# Patient Record
Sex: Female | Born: 1950 | Race: White | Hispanic: No | Marital: Married | Smoking: Former smoker
Health system: Southern US, Community
[De-identification: ages and names within clinical notes are randomized; demographics above are authoritative.]

---

## 2002-01-20 ENCOUNTER — Emergency Department (HOSPITAL_COMMUNITY): Admission: EM | Admit: 2002-01-20 | Discharge: 2002-01-20 | Payer: Self-pay

## 2011-07-31 ENCOUNTER — Ambulatory Visit (INDEPENDENT_AMBULATORY_CARE_PROVIDER_SITE_OTHER): Payer: 59

## 2011-07-31 DIAGNOSIS — J029 Acute pharyngitis, unspecified: Secondary | ICD-10-CM

## 2011-07-31 DIAGNOSIS — H9209 Otalgia, unspecified ear: Secondary | ICD-10-CM

## 2018-03-17 ENCOUNTER — Ambulatory Visit (INDEPENDENT_AMBULATORY_CARE_PROVIDER_SITE_OTHER): Payer: Medicare Other | Admitting: Physician Assistant

## 2018-03-17 VITALS — BP 149/88 | HR 68 | Temp 98.0°F | Resp 16 | Ht 66.0 in | Wt 214.0 lb

## 2018-03-17 DIAGNOSIS — R208 Other disturbances of skin sensation: Secondary | ICD-10-CM | POA: Diagnosis not present

## 2018-03-17 DIAGNOSIS — L255 Unspecified contact dermatitis due to plants, except food: Secondary | ICD-10-CM

## 2018-03-17 MED ORDER — CEPHALEXIN 500 MG PO CAPS: 500.0000 mg | ORAL_CAPSULE | Freq: Two times a day (BID) | ORAL | 0 refills | Status: DC

## 2018-03-17 MED ORDER — PREDNISONE 20 MG PO TABS: ORAL_TABLET | ORAL | 0 refills | Status: AC

## 2018-03-17 NOTE — Patient Instructions (Addendum)
    Take zyrtec 10 mg daily.     If you have lab work done today you will be contacted with your lab results within the next 2 weeks.  If you have not heard from us then please contact us. The fastest way to get your results is to register for My Chart.   IF you received an x-ray today, you will receive an invoice from [facility]. Please contact [facility] at [phone] with questions or concerns regarding your invoice.   IF you received labwork today, you will receive an invoice from [facility]. Please contact [facility] at [phone] with questions or concerns regarding your invoice.   Our billing staff will not be able to assist you with questions regarding bills from these companies.  You will be contacted with the lab results as soon as they are available. The fastest way to get your results is to activate your My Chart account. Instructions are located on the last page of this paperwork. If you have not heard from us regarding the results in 2 weeks, please contact this office.

## 2018-03-17 NOTE — Progress Notes (Signed)
    03/17/2018 8:47 AM   DOB: [birth year] / MRN: [id number]  SUBJECTIVE:  Nicole Moreno is a 67 y.o. 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 Symptoms present for 3 days.  The problem is worsening. She has tried calamine. Assoicates a painful place about the rash on her left arm.   She has no allergies on file.   She  has no past medical history on file.    She   She  has no sexual activity history on file. The patient  has no past surgical history on file.  Her family history is not on file.  Review of Systems  Constitutional: Negative for chills and fever.  Gastrointestinal: Negative for nausea.  Skin: Positive for itching and rash.  Neurological: Negative for dizziness.    The problem list and medications were reviewed and updated by myself where necessary and exist elsewhere in the encounter.   OBJECTIVE:  BP (!) 149/88   Pulse 68   Temp 98 F (36.7 C) (Oral)   Resp 16   Ht 5\' 6"  (1.676 m)   Wt 214 lb (97.1 kg)   SpO2 97%   BMI 34.54 kg/m   Wt Readings from Last 3 Encounters:  03/17/18 214 lb (97.1 kg)   Temp Readings from Last 3 Encounters:  03/17/18 98 F (36.7 C) (Oral)   BP Readings from Last 3 Encounters:  03/17/18 (!) 149/88   Pulse Readings from Last 3 Encounters:  03/17/18 68    Physical Exam  Constitutional: She is oriented to person, place, and time. She appears well-nourished. No distress.  Eyes: Pupils are equal, round, and reactive to light. EOM are normal.  Cardiovascular: Normal rate.  Pulmonary/Chest: Effort normal.  Abdominal: She exhibits no distension.  Neurological: She is alert and oriented to person, place, and time. No cranial nerve deficit. Gait normal.  Skin: Skin is dry. She is not diaphoretic.  Psychiatric: She has a normal mood and affect.  Vitals reviewed.            ASSESSMENT AND PLAN:  Jacqueline was seen today for rash.  Diagnoses and all orders for this  visit:  Rhus dermatitis -     predniSONE (DELTASONE) 20 MG tablet; Take 3 in the morning for 5 days, then 2 in the morning for 5 days, and then 1 in the morning for 5 days.  Localized tenderness of skin -     cephALEXin (KEFLEX) 500 MG capsule; Take 1 capsule (500 mg total) by mouth 2 (two) times daily.    The patient is advised to call or return to clinic if she does not see an improvement in symptoms, or to seek the care of the closest emergency department if she worsens with the above plan.   Deliah BostonMichael Clark, MHS, PA-C Primary Care at [facility] 03/17/2018 8:47 AM

## 2019-06-13 ENCOUNTER — Emergency Department (HOSPITAL_COMMUNITY): Payer: Medicare Other

## 2019-06-13 ENCOUNTER — Inpatient Hospital Stay (HOSPITAL_COMMUNITY): Payer: Medicare Other

## 2019-06-13 ENCOUNTER — Encounter (HOSPITAL_COMMUNITY): Payer: Self-pay | Admitting: Emergency Medicine

## 2019-06-13 ENCOUNTER — Inpatient Hospital Stay (HOSPITAL_COMMUNITY)
Admission: EM | Admit: 2019-06-13 | Discharge: 2019-06-23 | DRG: 233 | Disposition: E | Payer: Medicare Other | Attending: Surgery | Admitting: Surgery

## 2019-06-13 ENCOUNTER — Other Ambulatory Visit: Payer: Self-pay

## 2019-06-13 DIAGNOSIS — I509 Heart failure, unspecified: Secondary | ICD-10-CM

## 2019-06-13 DIAGNOSIS — I4901 Ventricular fibrillation: Secondary | ICD-10-CM | POA: Diagnosis not present

## 2019-06-13 DIAGNOSIS — J9601 Acute respiratory failure with hypoxia: Secondary | ICD-10-CM | POA: Diagnosis present

## 2019-06-13 DIAGNOSIS — I472 Ventricular tachycardia, unspecified: Secondary | ICD-10-CM

## 2019-06-13 DIAGNOSIS — I214 Non-ST elevation (NSTEMI) myocardial infarction: Principal | ICD-10-CM | POA: Diagnosis present

## 2019-06-13 DIAGNOSIS — I420 Dilated cardiomyopathy: Secondary | ICD-10-CM | POA: Diagnosis present

## 2019-06-13 DIAGNOSIS — E7800 Pure hypercholesterolemia, unspecified: Secondary | ICD-10-CM

## 2019-06-13 DIAGNOSIS — Z79899 Other long term (current) drug therapy: Secondary | ICD-10-CM

## 2019-06-13 DIAGNOSIS — E119 Type 2 diabetes mellitus without complications: Secondary | ICD-10-CM | POA: Diagnosis present

## 2019-06-13 DIAGNOSIS — R0602 Shortness of breath: Secondary | ICD-10-CM | POA: Diagnosis present

## 2019-06-13 DIAGNOSIS — Z7982 Long term (current) use of aspirin: Secondary | ICD-10-CM

## 2019-06-13 DIAGNOSIS — I5021 Acute systolic (congestive) heart failure: Secondary | ICD-10-CM | POA: Diagnosis not present

## 2019-06-13 DIAGNOSIS — Z20828 Contact with and (suspected) exposure to other viral communicable diseases: Secondary | ICD-10-CM | POA: Diagnosis present

## 2019-06-13 DIAGNOSIS — J449 Chronic obstructive pulmonary disease, unspecified: Secondary | ICD-10-CM | POA: Diagnosis present

## 2019-06-13 DIAGNOSIS — I5023 Acute on chronic systolic (congestive) heart failure: Secondary | ICD-10-CM | POA: Diagnosis present

## 2019-06-13 DIAGNOSIS — Z6841 Body Mass Index (BMI) 40.0 and over, adult: Secondary | ICD-10-CM | POA: Diagnosis not present

## 2019-06-13 DIAGNOSIS — Z87891 Personal history of nicotine dependence: Secondary | ICD-10-CM | POA: Diagnosis not present

## 2019-06-13 DIAGNOSIS — I340 Nonrheumatic mitral (valve) insufficiency: Secondary | ICD-10-CM | POA: Diagnosis not present

## 2019-06-13 DIAGNOSIS — Z9289 Personal history of other medical treatment: Secondary | ICD-10-CM

## 2019-06-13 DIAGNOSIS — Z8249 Family history of ischemic heart disease and other diseases of the circulatory system: Secondary | ICD-10-CM

## 2019-06-13 DIAGNOSIS — Z823 Family history of stroke: Secondary | ICD-10-CM | POA: Diagnosis not present

## 2019-06-13 DIAGNOSIS — E876 Hypokalemia: Secondary | ICD-10-CM

## 2019-06-13 DIAGNOSIS — I2510 Atherosclerotic heart disease of native coronary artery without angina pectoris: Secondary | ICD-10-CM | POA: Diagnosis present

## 2019-06-13 DIAGNOSIS — R570 Cardiogenic shock: Secondary | ICD-10-CM | POA: Diagnosis not present

## 2019-06-13 DIAGNOSIS — I469 Cardiac arrest, cause unspecified: Secondary | ICD-10-CM | POA: Diagnosis not present

## 2019-06-13 DIAGNOSIS — R7989 Other specified abnormal findings of blood chemistry: Secondary | ICD-10-CM

## 2019-06-13 DIAGNOSIS — I249 Acute ischemic heart disease, unspecified: Secondary | ICD-10-CM | POA: Diagnosis present

## 2019-06-13 DIAGNOSIS — E785 Hyperlipidemia, unspecified: Secondary | ICD-10-CM | POA: Diagnosis present

## 2019-06-13 DIAGNOSIS — Z951 Presence of aortocoronary bypass graft: Secondary | ICD-10-CM

## 2019-06-13 DIAGNOSIS — J9600 Acute respiratory failure, unspecified whether with hypoxia or hypercapnia: Secondary | ICD-10-CM | POA: Diagnosis present

## 2019-06-13 DIAGNOSIS — R778 Other specified abnormalities of plasma proteins: Secondary | ICD-10-CM | POA: Diagnosis not present

## 2019-06-13 DIAGNOSIS — Z09 Encounter for follow-up examination after completed treatment for conditions other than malignant neoplasm: Secondary | ICD-10-CM

## 2019-06-13 DIAGNOSIS — Z9911 Dependence on respirator [ventilator] status: Secondary | ICD-10-CM

## 2019-06-13 DIAGNOSIS — I25110 Atherosclerotic heart disease of native coronary artery with unstable angina pectoris: Secondary | ICD-10-CM | POA: Diagnosis not present

## 2019-06-13 DIAGNOSIS — Z01810 Encounter for preprocedural cardiovascular examination: Secondary | ICD-10-CM | POA: Diagnosis not present

## 2019-06-13 LAB — BRAIN NATRIURETIC PEPTIDE: B Natriuretic Peptide: 465 pg/mL — ABNORMAL HIGH (ref 0.0–100.0)

## 2019-06-13 LAB — CBC WITH DIFFERENTIAL/PLATELET
Abs Immature Granulocytes: 0.06 10*3/uL (ref 0.00–0.07)
Basophils Absolute: 0.1 10*3/uL (ref 0.0–0.1)
Basophils Relative: 1 %
Eosinophils Absolute: 0.1 10*3/uL (ref 0.0–0.5)
Eosinophils Relative: 1 %
HCT: 43.7 % (ref 36.0–46.0)
Hemoglobin: 14.3 g/dL (ref 12.0–15.0)
Immature Granulocytes: 1 %
Lymphocytes Relative: 15 %
Lymphs Abs: 1.9 10*3/uL (ref 0.7–4.0)
MCH: 30.2 pg (ref 26.0–34.0)
MCHC: 32.7 g/dL (ref 30.0–36.0)
MCV: 92.4 fL (ref 80.0–100.0)
Monocytes Absolute: 0.8 10*3/uL (ref 0.1–1.0)
Monocytes Relative: 7 %
Neutro Abs: 9.9 10*3/uL — ABNORMAL HIGH (ref 1.7–7.7)
Neutrophils Relative %: 75 %
Platelets: 263 10*3/uL (ref 150–400)
RBC: 4.73 MIL/uL (ref 3.87–5.11)
RDW: 13.2 % (ref 11.5–15.5)
WBC: 12.8 10*3/uL — ABNORMAL HIGH (ref 4.0–10.5)
nRBC: 0 % (ref 0.0–0.2)

## 2019-06-13 LAB — TROPONIN I (HIGH SENSITIVITY)
Troponin I (High Sensitivity): 388 ng/L (ref <18)
Troponin I (High Sensitivity): 349 ng/L (ref <18)

## 2019-06-13 LAB — BASIC METABOLIC PANEL
Anion gap: 11 (ref 5–15)
BUN: 17 mg/dL (ref 8–23)
CO2: 21 mmol/L — ABNORMAL LOW (ref 22–32)
Calcium: 9.1 mg/dL (ref 8.9–10.3)
Chloride: 108 mmol/L (ref 98–111)
Creatinine, Ser: 0.7 mg/dL (ref 0.44–1.00)
GFR calc Af Amer: >60 mL/min (ref >60)
GFR calc non Af Amer: >60 mL/min (ref >60)
Glucose, Bld: 128 mg/dL — ABNORMAL HIGH (ref 70–99)
Potassium: 3.5 mmol/L (ref 3.5–5.1)
Sodium: 140 mmol/L (ref 135–145)

## 2019-06-13 LAB — BLOOD GAS, ARTERIAL
Acid-base deficit: 4.4 mmol/L — ABNORMAL HIGH (ref 0.0–2.0)
Bicarbonate: 20.4 mmol/L (ref 20.0–28.0)
FIO2: 100
O2 Saturation: 99 %
Patient temperature: 38
pCO2 arterial: 44.5 mmHg (ref 32.0–48.0)
pH, Arterial: 7.296 — ABNORMAL LOW (ref 7.350–7.450)
pO2, Arterial: 248 mmHg — ABNORMAL HIGH (ref 83.0–108.0)

## 2019-06-13 LAB — PROCALCITONIN: Procalcitonin: <0.1 ng/mL

## 2019-06-13 LAB — PROTIME-INR
INR: 1.1 (ref 0.8–1.2)
Prothrombin Time: 14.1 seconds (ref 11.4–15.2)

## 2019-06-13 MED ORDER — IOHEXOL 350 MG/ML SOLN: 100.0000 mL | Freq: Once | INTRAVENOUS | Status: DC | PRN

## 2019-06-13 MED ORDER — HEPARIN BOLUS VIA INFUSION
4000.0000 [IU] | Freq: Once | INTRAVENOUS | Status: AC
  Administered 2019-06-13: 4000 [IU] via INTRAVENOUS

## 2019-06-13 MED ORDER — ALBUTEROL SULFATE HFA 108 (90 BASE) MCG/ACT IN AERS
1.0000 | INHALATION_SPRAY | RESPIRATORY_TRACT | Status: DC | PRN
  Filled 2019-06-13: qty 6.7

## 2019-06-13 MED ORDER — ALBUTEROL SULFATE HFA 108 (90 BASE) MCG/ACT IN AERS
2.0000 | INHALATION_SPRAY | Freq: Four times a day (QID) | RESPIRATORY_TRACT | Status: DC
  Administered 2019-06-13: 2 via RESPIRATORY_TRACT
  Filled 2019-06-13: qty 6.7

## 2019-06-13 MED ORDER — FUROSEMIDE 10 MG/ML IJ SOLN
60.0000 mg | Freq: Once | INTRAMUSCULAR | Status: AC
  Administered 2019-06-13: 60 mg via INTRAVENOUS
  Filled 2019-06-13: qty 6

## 2019-06-13 MED ORDER — ONDANSETRON HCL 4 MG PO TABS: 4.0000 mg | ORAL_TABLET | Freq: Four times a day (QID) | ORAL | Status: DC | PRN

## 2019-06-13 MED ORDER — HEPARIN (PORCINE) 25000 UT/250ML-% IV SOLN
1750.0000 [IU]/h | INTRAVENOUS | Status: DC
  Administered 2019-06-13: 1200 [IU]/h via INTRAVENOUS
  Administered 2019-06-15: 1750 [IU]/h via INTRAVENOUS
  Filled 2019-06-13 (×3): qty 250

## 2019-06-13 MED ORDER — POTASSIUM CHLORIDE CRYS ER 20 MEQ PO TBCR
40.0000 meq | EXTENDED_RELEASE_TABLET | Freq: Once | ORAL | Status: AC
  Administered 2019-06-13: 40 meq via ORAL
  Filled 2019-06-13: qty 2

## 2019-06-13 MED ORDER — ACETAMINOPHEN 325 MG PO TABS: 650.0000 mg | ORAL_TABLET | Freq: Four times a day (QID) | ORAL | Status: DC | PRN

## 2019-06-13 MED ORDER — POLYETHYLENE GLYCOL 3350 17 G PO PACK: 17.0000 g | PACK | Freq: Every day | ORAL | Status: DC | PRN

## 2019-06-13 MED ORDER — ONDANSETRON HCL 4 MG/2ML IJ SOLN: 4.0000 mg | Freq: Four times a day (QID) | INTRAMUSCULAR | Status: DC | PRN

## 2019-06-13 MED ORDER — ACETAMINOPHEN 650 MG RE SUPP: 650.0000 mg | Freq: Four times a day (QID) | RECTAL | Status: DC | PRN

## 2019-06-13 MED ORDER — LORAZEPAM 2 MG/ML IJ SOLN
1.0000 mg | Freq: Once | INTRAMUSCULAR | Status: AC
  Administered 2019-06-13: 1 mg via INTRAVENOUS
  Filled 2019-06-13: qty 1

## 2019-06-13 MED ORDER — FUROSEMIDE 10 MG/ML IJ SOLN
40.0000 mg | Freq: Two times a day (BID) | INTRAMUSCULAR | Status: DC
  Administered 2019-06-14 – 2019-06-15 (×3): 40 mg via INTRAVENOUS
  Filled 2019-06-13 (×3): qty 4

## 2019-06-13 MED ORDER — ASPIRIN 81 MG PO CHEW
324.0000 mg | CHEWABLE_TABLET | Freq: Once | ORAL | Status: AC
  Administered 2019-06-13: 324 mg via ORAL
  Filled 2019-06-13: qty 4

## 2019-06-13 NOTE — Progress Notes (Signed)
Rt went into room to check BIPAP and found patient sitting in chair talking on the phone to family. Patient had urinated on herself because she had to wait to long to go to the bathroom.She stated that she was feeling better and wanted to go home. She stated she was not going to [place]. She spoke with the physician and they told her since she was feeling better she didn't need to go to [place]. So patient agreed that she would remain in hospital here at [facility] and be taken care of. Patient stated she had removed her BIPAP and all her monitor leads because she had to go to the bathroom. Rt placed her pulse ox back on to check her saturation and her SpO2 was 95-98% on room air. BIPAP is still in the room on stand by. Rt will continue to monitor patient.

## 2019-06-13 NOTE — H&P (Addendum)
History and Physical    Nicole Moreno ZOX:[id number] DOB: [birth year] DOA: 01-03-19  PCP: Patient, No Pcp Per   Patient coming from: Home  I have personally briefly reviewed patient's old medical records in [facility]  Chief Complaint: Chest pain, SOB  HPI: Nicole Moreno is a [age] y.o. female with no known medical history significant presented to the Ed with c/o chest pain and difficulty breathing of 3 weeks duration. Difficulty breathing was especially worse with exertion, over the past week. Chest pain- non radiating also present with exertion and improves with rest. On my initial evaluation patient was on BIPAP and mildly lethargic.  She answers a few questions by nodding, but most of the history was obtained from chart review and ED provider. But she denied cough and COVID positive contacts, no fevers, she also denies weight gain. Father had heart disease at age of 68.  ED Course: heart rate initially WNL, later tachycardic to 114, tachypneic, O2 sats 88% on room air, patient respiratory status suddenly declined with Increased work of breathing and tachypnea, subsequently moved to negative pressure room and placed on BIPAP.  COVID-19 pending.  Initial chest x-ray 2 view diffuse linear and hazy perihilar lung opacities pulmonary edema favored.  Atypical infection on the differential.  Repeat portable chest x-ray - worsening lung aeration compared to earlier exam, increased interstitial and hazy airspace lung opacities.  Pulmonary edema favored as etiology.   BNP elevated at 465,  Hs troponin 388 > 349.  60 mg IV Lasix x1 given. EDP talked to cardiology after first troponin, recommendations to rule out pulmonary embolism with CTA chest, if negative consider ACS as likely etiology and [facility] management.  IV heparin GTT recommended.  Review of Systems: As per HPI all other systems reviewed and negative.  History reviewed. No pertinent past medical history.  History  reviewed. No pertinent surgical history.   reports that she quit smoking about 14 years ago. She has never used smokeless tobacco. She reports that she does not drink alcohol or use drugs.  No Known Allergies  Father had heart disease at age of 68.  Prior to Admission medications   Not on File    Physical Exam: Vitals:   Dec 12, 2018 1800 Dec 12, 2018 1830 Dec 12, 2018 1840 Dec 12, 2018 1915  BP: (!) 150/103     Pulse: (!) 110 (!) 103  (!) 111  Resp: (!) 32 (!) 26  (!) 22  Temp:      TempSrc:      SpO2: 95% 100% 92% 100%  Weight:      Height:        Constitutional: On BiPAP, lethargic but easily arousable Vitals:   Dec 12, 2018 1800 Dec 12, 2018 1830 Dec 12, 2018 1840 Dec 12, 2018 1915  BP: (!) 150/103     Pulse: (!) 110 (!) 103  (!) 111  Resp: (!) 32 (!) 26  (!) 22  Temp:      TempSrc:      SpO2: 95% 100% 92% 100%  Weight:      Height:       Eyes: PERRL, lids and conjunctivae normal ENMT: On BiPAP.   Neck: normal, supple, no masses, no thyromegaly Respiratory: On BiPAP, diffuse expiratory wheezing- likely cardiac wheeze, Normal respiratory effort. No accessory muscle use.  Cardiovascular: Regular rate and rhythm, no murmurs / rubs / gallops.  Trace bilateral extremity edema. 2+ pedal pulses.  Abdomen: no tenderness, no masses palpated. No hepatosplenomegaly. Bowel sounds positive.  Musculoskeletal: no clubbing /  cyanosis. No joint deformity upper and lower extremities. Good ROM, no contractures. Normal muscle tone.  Skin: no rashes, lesions, ulcers. No induration Neurologic: CN 2-12 grossly intact. Strength 5/5 in all 4.  Psychiatric: Normal judgment and insight.  Lethargic. Normal mood.   Labs on Admission: I have personally reviewed following labs and imaging studies  CBC: Recent Labs  Lab 06/17/2019 1507  WBC 12.8*  NEUTROABS 9.9*  HGB 14.3  HCT 43.7  MCV 92.4  PLT 846   Basic Metabolic Panel: Recent Labs  Lab 05/26/2019 1507  NA 140  K 3.5  CL 108  CO2 21*  GLUCOSE 128*  BUN 17   CREATININE 0.70  CALCIUM 9.1   Coagulation Profile: Recent Labs  Lab 06/03/2019 1813  INR 1.1   Radiological Exams on Admission: Dg Chest 2 View  Result Date: 06/05/2019 CLINICAL DATA:  Worsening dyspnea EXAM: CHEST - 2 VIEW COMPARISON:  None. FINDINGS: Borderline enlargement of the cardiopericardial silhouette. Otherwise normal mediastinal contour. No pneumothorax. Trace right pleural effusion. No left pleural effusion. Diffuse linear and hazy parahilar lung opacities. IMPRESSION: 1. Diffuse linear and hazy parahilar lung opacities, favor pulmonary edema given borderline mild enlargement of the cardiopericardial silhouette, with atypical infection on the differential. 2. Trace right pleural effusion. Electronically Signed   By: Ilona Sorrel M.D.   On: 06/22/2019 15:47   Dg Chest Portable 1 View  Result Date: 05/25/2019 CLINICAL DATA:  Worsening shortness of breath. EXAM: PORTABLE CHEST 1 VIEW COMPARISON:  06/03/2019 at 3:21 a.m. FINDINGS: Lung opacities have increased compared to the earlier exam. There are interstitial and hazy airspace opacities bilaterally, centrally predominant. Lung base opacity partially obscures the hemidiaphragms suggesting small effusions. IMPRESSION: 1. Worsened lung aeration compared to the earlier exam. Increased interstitial and hazy airspace lung opacities. Pulmonary edema favored as the etiology. Electronically Signed   By: Lajean Manes M.D.   On: 05/25/2019 18:38    EKG: Independently reviewed.  Sinus rhythm QTc 468.  T wave inversions in lead I and aVL, repolarization abnormality in lead V3 only.  No old EKG to compare.  Assessment/Plan Active Problems:   Acute respiratory failure (HCC)    Acute hypoxic respiratory failure-O2 sats initially 88% on room air, decline in respiratory status in the ED subsequently placed on BiPAP.  Sats greater than 90% at this time.  COVID-19 test pending.  WBC 12.8.  BNP elevated at 465.  Repeat chest x-ray in the ED shows  worsening lung aeration compared to earlier exam.  Pulmonary edema favored as etiology.  Trace bilateral lower extremity edema.  Few weights in chart, so ?? Accuracy, but appears over the past year patient has gained weight 214lbs  02/2018 to 283 pounds today.  Etiology of respiratory failure likely congestive heart failure/pulmonary edema. -Rule out PE with CTA chest  -In the interim continue heparin GTT -Follow-up COVID-19 test -IV Lasix 60 mg x 1 given in ED, continue 40 mg every 12 hourly -Obtain echocardiogram -Strict input output, daily weights, fluid restrict -BMP a.m, CBC a.m. -Obtain pro-calcitonin- <0.1 - Scheduled and PRN albuterol inhaler -Continue BiPAP for now, supplemental O2, respiratory protocol -ABG shows pH of 7.29, PCO2 of 44, PaO2 248 on BiPAP.  Serum bicarb of 21, with normal anion gap 11, suggesting a normal anion gap metabolic acidosis.  -Addendum- patient feeling better after Lasix, suggesting more of CHF as etiology.  On my reevaluation she is alert and oriented.  Initially requested to leave the hospital, later agreed to stay.  She vehemently declines transfer to [facility].  I have explained we do not have cardiology here tomorrow, she still wants to be admitted here at [facility].   NSTEMI-anginal type chest pain, Hs troponin 388 >> 349.  EKG with T wave abnormalities in lateral leads, ?  Repol abnormalities ST segment V3 only.  Elevated troponin likely from demand ischemia from congestive heart failure.  Also concern for ACS- but downtrending troponin reassuring.  EDP talked to cardiology with recommendations to obtain CTA rule out PE otherwise consider ACS, start heparin GTT and consider transfer to [facility]. -Aspirin 325 given in ED - Cont heparin GTT for now -Repeat EKG in a.m. -Obtain echocardiogram -Pending CTA results, may need benefit from cardiology consultation in a.m. -Repeat troponin  DVT prophylaxis: Heparin Code Status: Full code Family  Communication: None at bedside Disposition Plan: > 2 days Consults called: May need cardiology consult pending further work-up. Admission status: Inpatient, stepdown I certify that at the point of admission it is my clinical judgment that the patient will require inpatient hospital care spanning beyond 2 midnights from the point of admission due to high intensity of service, high risk for further deterioration and high frequency of surveillance required. The following factors support the patient status of inpatient: Acute respiratory failure requiring respiratory support with BiPAP at this time on his stepdown level of care.   Onnie Boer MD [facility]  18-Jun-2019, 10:02 PM

## 2019-06-13 NOTE — ED Triage Notes (Signed)
Pt states she has been progressively short of breath for the past 3 weeks without fever or significant cough. States within the past few days she would be unable to walk 25 ft without significant difficulty.

## 2019-06-13 NOTE — ED Notes (Signed)
Paged Dr. Harl Bowie, Cardiology again as requested by Dr. Roslynn Amble

## 2019-06-13 NOTE — Progress Notes (Addendum)
Discussed patient with ER staff. Presents with SOB, some chest pain. Elevated trop, some lateral ST/T changes on EKG in setting of LVH may be strain vs ischemia. She is getting a CT PE, if negative would think ACS is likely etiology and would plan for transfer to [facility] for management as NSTEMI. Some hypoxia and fluid on CXR with elevated BNP, have recommended some IV lasix in ER, have also recommended starting IV hep gtt   Zandra Abts MD

## 2019-06-13 NOTE — ED Provider Notes (Addendum)
[facility] EMERGENCY DEPARTMENT Provider Note   CSN: [id number] Arrival date & time: 05/29/2019  1422     History   Chief Complaint Chief Complaint  Patient presents with   Shortness of Breath    HPI Nicole Moreno is a 68 y.o. female.  Presents emerged department with chief complaint of shortness of breath.  States that she first noticed the shortness of breath around 3 weeks ago, but over the past week it has significantly worsened.  Seems like it has been slowly worsening, no sudden changes.  Notices shortness of breath worse with exertion.  Also notices mild amount of chest pain when she is exerting herself.  Pain is dull, achy, mild, nonradiating.  Improves with rest.  Currently at rest, patient denies any shortness of breath or chest pain.  She does not have any associated fevers, no cough, no sick contacts.  No abdominal pain, nausea or vomiting.   Denies any medical problems, smoked many years ago.  Father had heart disease at age 44.  Does not take any chronic medications, no recent surgeries or periods of immobilization.    HPI  History reviewed. No pertinent past medical history.  Patient Active Problem List   Diagnosis Date Noted   Acute respiratory failure (HCC) 06/12/2019    History reviewed. No pertinent surgical history.   OB History   No obstetric history on file.      Home Medications    Prior to Admission medications   Medication Sig Start Date End Date Taking? Authorizing Provider  aspirin 81 MG chewable tablet Chew 81 mg by mouth daily as needed for mild pain or moderate pain.   Yes [provider]  ibuprofen (ADVIL) 200 MG tablet Take 200 mg by mouth every 6 (six) hours as needed for fever or moderate pain.   Yes [provider]    Family History History reviewed. No pertinent family history.  Social History Social History   Tobacco Use   Smoking status: Former Smoker    Quit date: 07/2004    Years since quitting:  14.8   Smokeless tobacco: Never Used  Substance Use Topics   Alcohol use: Never    Frequency: Never   Drug use: Never     Allergies   Patient has no known allergies.   Review of Systems Review of Systems  Constitutional: Negative for chills and fever.  HENT: Negative for ear pain and sore throat.   Eyes: Negative for pain and visual disturbance.  Respiratory: Positive for shortness of breath. Negative for cough.   Cardiovascular: Positive for chest pain. Negative for palpitations.  Gastrointestinal: Negative for abdominal pain and vomiting.  Genitourinary: Negative for dysuria and hematuria.  Musculoskeletal: Negative for arthralgias and back pain.  Skin: Negative for color change and rash.  Neurological: Negative for seizures and syncope.  All other systems reviewed and are negative.    Physical Exam Updated Vital Signs BP (!) 150/103    Pulse (!) 111    Temp 97.8 F (36.6 C) (Oral)    Resp (!) 22    Ht  (1.676 m)    Wt 128.4 kg    SpO2 100%    BMI 45.68 kg/m   Physical Exam Vitals signs and nursing note reviewed.  Constitutional:      General: She is not in acute distress.    Appearance: She is well-developed.  HENT:     Head: Normocephalic and atraumatic.     Mouth/Throat:  Mouth: Mucous membranes are moist.  Eyes:     Conjunctiva/sclera: Conjunctivae normal.  Neck:     Musculoskeletal: Neck supple.  Cardiovascular:     Rate and Rhythm: Normal rate and regular rhythm.     Heart sounds: No murmur.  Pulmonary:     Effort: No respiratory distress.     Comments: Mild tachypnea but clear breath sounds Chest:     Chest wall: No mass or deformity.  Abdominal:     Palpations: Abdomen is soft.     Tenderness: There is no abdominal tenderness.  Musculoskeletal:     Right lower leg: No edema.     Left lower leg: No edema.  Skin:    General: Skin is warm and dry.     Capillary Refill: Capillary refill takes less than 2 seconds.  Neurological:      General: No focal deficit present.     Mental Status: She is alert and oriented to person, place, and time.  Psychiatric:        Mood and Affect: Mood normal.        Behavior: Behavior normal.      ED Treatments / Results  Labs (all labs ordered are listed, but only abnormal results are displayed) Labs Reviewed  CBC WITH DIFFERENTIAL/PLATELET - Abnormal; Notable for the following components:      Result Value   WBC 12.8 (*)    Neutro Abs 9.9 (*)    All other components within normal limits  BASIC METABOLIC PANEL - Abnormal; Notable for the following components:   CO2 21 (*)    Glucose, Bld 128 (*)    All other components within normal limits  BRAIN NATRIURETIC PEPTIDE - Abnormal; Notable for the following components:   B Natriuretic Peptide 465.0 (*)    All other components within normal limits  BLOOD GAS, ARTERIAL - Abnormal; Notable for the following components:   pH, Arterial 7.296 (*)    pO2, Arterial 248 (*)    Acid-base deficit 4.4 (*)    All other components within normal limits  TROPONIN I (HIGH SENSITIVITY) - Abnormal; Notable for the following components:   Troponin I (High Sensitivity) 388 (*)    All other components within normal limits  TROPONIN I (HIGH SENSITIVITY) - Abnormal; Notable for the following components:   Troponin I (High Sensitivity) 349 (*)    All other components within normal limits  SARS CORONAVIRUS 2 (TAT 6-24 HRS)  PROTIME-INR  PROCALCITONIN  CBC  HEPARIN LEVEL (UNFRACTIONATED)  HEPARIN LEVEL (UNFRACTIONATED)    EKG EKG Interpretation  Date/Time:  Saturday June 13 2019 16:30:01 EST Ventricular Rate:  92 PR Interval:    QRS Duration: 101 QT Interval:  378 QTC Calculation: 468 R Axis:   -27 Text Interpretation: Sinus rhythm LVH with secondary repolarization abnormality no acute STEMI no changes when compared to ecg earlier same day Confirmed by Marianna Fuss ([id number]) on 05/27/2019 4:49:11 PM   Radiology Dg Chest 2  View  Result Date: 05/30/2019 CLINICAL DATA:  Worsening dyspnea EXAM: CHEST - 2 VIEW COMPARISON:  None. FINDINGS: Borderline enlargement of the cardiopericardial silhouette. Otherwise normal mediastinal contour. No pneumothorax. Trace right pleural effusion. No left pleural effusion. Diffuse linear and hazy parahilar lung opacities. IMPRESSION: 1. Diffuse linear and hazy parahilar lung opacities, favor pulmonary edema given borderline mild enlargement of the cardiopericardial silhouette, with atypical infection on the differential. 2. Trace right pleural effusion. Electronically Signed   By: Delbert Phenix M.D.   On: 06/17/2019  15:47   Dg Chest Portable 1 View  Result Date: 05/22/19 CLINICAL DATA:  Worsening shortness of breath. EXAM: PORTABLE CHEST 1 VIEW COMPARISON:  05/22/19 at 3:21 a.m. FINDINGS: Lung opacities have increased compared to the earlier exam. There are interstitial and hazy airspace opacities bilaterally, centrally predominant. Lung base opacity partially obscures the hemidiaphragms suggesting small effusions. IMPRESSION: 1. Worsened lung aeration compared to the earlier exam. Increased interstitial and hazy airspace lung opacities. Pulmonary edema favored as the etiology. Electronically Signed   By: Amie Portlandavid  Ormond M.D.   On: 05/22/19 18:38    Procedures .Critical Care Performed by: Milagros Lollykstra, Demarques Pilz S, MD Authorized by: Milagros Lollykstra, Ramar Nobrega S, MD   Critical care provider statement:    Critical care time (minutes):  56   Critical care was necessary to treat or prevent imminent or life-threatening deterioration of the following conditions:  Cardiac failure and respiratory failure   Critical care was time spent personally by me on the following activities:  Discussions with consultants, evaluation of patient's response to treatment, examination of patient, ordering and performing treatments and interventions, ordering and review of laboratory studies, ordering and review of  radiographic studies, pulse oximetry, re-evaluation of patient's condition, obtaining history from patient or surrogate and review of old charts   (including critical care time)  Medications Ordered in ED Medications  iohexol (OMNIPAQUE) 350 MG/ML injection 100 mL (has no administration in time range)  heparin ADULT infusion 100 units/mL (25000 units/26050mL sodium chloride 0.45%) (1,200 Units/hr Intravenous New Bag/Given July 31, 2018 1815)  aspirin chewable tablet 324 mg (324 mg Oral Given July 31, 2018 1813)  furosemide (LASIX) injection 60 mg (60 mg Intravenous Given July 31, 2018 1812)  heparin bolus via infusion 4,000 Units (4,000 Units Intravenous Bolus from Bag July 31, 2018 1816)  LORazepam (ATIVAN) injection 1 mg (1 mg Intravenous Given July 31, 2018 1755)     Initial Impression / Assessment and Plan / ED Course  I have reviewed the triage vital signs and the nursing notes.  Pertinent labs & imaging results that were available during my care of the patient were reviewed by me and considered in my medical decision making (see chart for details).  Clinical Course as of Jun 12 2057  Sat Jun 13, 2019  1608 Reviewed CXR, labs, troponin elevation, BNP elevation, possible pulmonary edema on CXR, will check CTA chest to rule out PE, will repeat EKG to evaluate for any dynamic ST changes   [RD]  1611 Troponin I (High Sensitivity)(!!): 388 [RD]  1611 B Natriuretic Peptide(!): 465.0 [RD]  1611 DG Chest 2 View [RD]  1653 Discussed with Branch - agrees no STEMI, if CTA negative, requests call back, likely will accept and treat as NSTEMI, recommends empirically starting heparin now   [RD]  1811 Called to bedside, increased work of breathing, discussed with RT, will start bipap, move to neg pressure room, giving lasix, starting heparin   [RD]  1811 Will notify Branch   [RD]  1818 Moved to neg pressure room for bipap   [RD]  1839 Talked to hospitalist who will evaluate   [RD]  1839 Repeat cxr concerning for  worsening heart failure   [RD]  1946 Rechecked, significant UOP, breathing markedly improved on BiPAP   [RD]  2037 Notified patient threatening to leave - discussed need for patient to stay, she is willing to stay and receive further treatment at this time   [RD]    Clinical Course User Index [RD] Milagros Lollykstra, Zakar Brosch S, MD      68 year old lady presents  to ER with worsening dyspnea on exertion.  Initially at rest patient noted to be mildly tachypneic but in no respiratory distress, required small supplemental oxygen.  EKG without acute ischemic changes, but high-sensitivity troponin was elevated, BNP also elevated.  Suspect ACS with new onset heart failure versus acute pulmonary embolism.  I discussed case with Dr. Harl Bowie with cardiology, agree with ruling out PE, initiating heparin.  Attempted CTA chest however, patient is unable to lie flat due to shortness of breath.  Patient became acutely dyspneic, increased oxygen requirement, repeat chest x-ray with worsening pulmonary edema.  Started Lasix, BiPAP.  Patient had marked improvement in symptoms, good urine output.  Discussed case with the hospitalist service who will assess patient.  If repeat troponin stable or downtrending, she will likely place orders for admission here, if troponin uptrending, she will admit to [facility] where interventional cardiology is available.    Dr. Denton Brick accepting.    Final Clinical Impressions(s) / ED Diagnoses   Final diagnoses:  Acute heart failure, unspecified heart failure type ([place])  Acute respiratory failure with hypoxia (HCC)  Elevated troponin  Elevated brain natriuretic peptide (BNP) level    ED Discharge Orders    None       Lucrezia Starch, MD 05/25/2019 2058  Addend note 1/128 to include critical care service rendered on 11/21    Lucrezia Starch, MD 06/20/19 1218

## 2019-06-13 NOTE — ED Notes (Signed)
This NT went in to patient room to place pure wick, patient was sitting on side of bed putting on shoes and asked "what now?" I advised patient I was placing pure wick for urine output. Patient told this Nurse Tech that she did not want the pure wick and wanted to be discharged immediately.

## 2019-06-13 NOTE — ED Notes (Signed)
CRITICAL VALUE ALERT  Critical Value:  Trop 388  Date & Time Notied:  05/25/2019, 1549  Provider Notified: Dr. Roslynn Amble  Orders Received/Actions taken: none

## 2019-06-13 NOTE — ED Notes (Signed)
PT screaming in to the hall "Someone come in here I need help." Upon arrival to room it was noted pt had removed her nasal canula and s

## 2019-06-13 NOTE — Progress Notes (Signed)
ANTICOAGULATION CONSULT NOTE - Initial Consult  Pharmacy Consult for heparin Indication: chest pain/ACS/NSTEMI  No Known Allergies  Patient Measurements: Height: 5\' 6"  (167.6 cm) Weight: 283 lb (128.4 kg) IBW/kg (Calculated) : 59.3 Heparin Dosing Weight: 90 kg  Vital Signs: Temp: 97.8 F (36.6 C) (11/21 1437) Temp Source: Oral (11/21 1437) BP: 141/87 (11/21 1530) Pulse Rate: 85 (11/21 1530)  Labs: Recent Labs    06-22-19 1507  HGB 14.3  HCT 43.7  PLT 263  CREATININE 0.70  TROPONINIHS 388*    Estimated Creatinine Clearance: 92.3 mL/min (by C-G formula based on SCr of 0.7 mg/dL).   Medical History: History reviewed. No pertinent past medical history.  Medications:  (Not in a hospital admission)   Assessment: Pharmacy consulted to dose heparin in patient with NSTEMI.  Patient is not on anticoagulation prior to admission.  Troponin on admission 388  Goal of Therapy:  Heparin level 0.3-0.7 units/ml Monitor platelets by anticoagulation protocol: Yes   Plan:  Give 4000 units bolus x 1 Start heparin infusion at 1200 units/hr Check anti-Xa level in 6-8 hours and daily while on heparin Continue to monitor H&H and platelets  Revonda Standard Kincaid Tiger 06-22-2019,5:05 PM

## 2019-06-13 NOTE — ED Notes (Signed)
Upon entering room, pt yelling that she was going home because she urinated on self. Pt IV pulled out of arm, and Bipap mask on floor. Notified Dykstra, MD to speak with pt.

## 2019-06-13 NOTE — Progress Notes (Signed)
Pt placed into negative pressure room and placed on BIPAP

## 2019-06-13 NOTE — ED Notes (Signed)
RRT states they will give inhaler.

## 2019-06-14 ENCOUNTER — Inpatient Hospital Stay (HOSPITAL_COMMUNITY): Payer: Medicare Other

## 2019-06-14 ENCOUNTER — Encounter (HOSPITAL_COMMUNITY): Payer: Self-pay | Admitting: Cardiovascular Disease

## 2019-06-14 DIAGNOSIS — I249 Acute ischemic heart disease, unspecified: Secondary | ICD-10-CM | POA: Diagnosis present

## 2019-06-14 DIAGNOSIS — I340 Nonrheumatic mitral (valve) insufficiency: Secondary | ICD-10-CM

## 2019-06-14 DIAGNOSIS — I214 Non-ST elevation (NSTEMI) myocardial infarction: Principal | ICD-10-CM

## 2019-06-14 LAB — CBC
HCT: 46.8 % — ABNORMAL HIGH (ref 36.0–46.0)
Hemoglobin: 14.9 g/dL (ref 12.0–15.0)
MCH: 29.6 pg (ref 26.0–34.0)
MCHC: 31.8 g/dL (ref 30.0–36.0)
MCV: 93 fL (ref 80.0–100.0)
Platelets: 277 10*3/uL (ref 150–400)
RBC: 5.03 MIL/uL (ref 3.87–5.11)
RDW: 13.1 % (ref 11.5–15.5)
WBC: 15 10*3/uL — ABNORMAL HIGH (ref 4.0–10.5)
nRBC: 0 % (ref 0.0–0.2)

## 2019-06-14 LAB — HIV ANTIBODY (ROUTINE TESTING W REFLEX): HIV Screen 4th Generation wRfx: NONREACTIVE

## 2019-06-14 LAB — TROPONIN I (HIGH SENSITIVITY)
Troponin I (High Sensitivity): 3101 ng/L (ref <18)
Troponin I (High Sensitivity): 4890 ng/L (ref <18)
Troponin I (High Sensitivity): 5150 ng/L (ref <18)
Troponin I (High Sensitivity): 5331 ng/L (ref <18)
Troponin I (High Sensitivity): 5781 ng/L (ref <18)

## 2019-06-14 LAB — ECHOCARDIOGRAM COMPLETE
Height: 66 in
Weight: 4528 oz

## 2019-06-14 LAB — HEPARIN LEVEL (UNFRACTIONATED)
Heparin Unfractionated: 0.1 IU/mL — ABNORMAL LOW (ref 0.30–0.70)
Heparin Unfractionated: 0.25 IU/mL — ABNORMAL LOW (ref 0.30–0.70)
Heparin Unfractionated: 0.27 IU/mL — ABNORMAL LOW (ref 0.30–0.70)

## 2019-06-14 LAB — BASIC METABOLIC PANEL
Anion gap: 13 (ref 5–15)
BUN: 15 mg/dL (ref 8–23)
CO2: 22 mmol/L (ref 22–32)
Calcium: 9.5 mg/dL (ref 8.9–10.3)
Chloride: 105 mmol/L (ref 98–111)
Creatinine, Ser: 0.69 mg/dL (ref 0.44–1.00)
GFR calc Af Amer: >60 mL/min (ref >60)
GFR calc non Af Amer: >60 mL/min (ref >60)
Glucose, Bld: 148 mg/dL — ABNORMAL HIGH (ref 70–99)
Potassium: 3.5 mmol/L (ref 3.5–5.1)
Sodium: 140 mmol/L (ref 135–145)

## 2019-06-14 LAB — SARS CORONAVIRUS 2 (TAT 6-24 HRS): SARS Coronavirus 2: NEGATIVE

## 2019-06-14 MED ORDER — SODIUM CHLORIDE 0.9 % IV SOLN
INTRAVENOUS | Status: DC
  Administered 2019-06-15: 06:00:00 via INTRAVENOUS

## 2019-06-14 MED ORDER — ASPIRIN 81 MG PO CHEW: 324.0000 mg | CHEWABLE_TABLET | ORAL | Status: DC

## 2019-06-14 MED ORDER — LOSARTAN POTASSIUM 25 MG PO TABS: 25.0000 mg | ORAL_TABLET | Freq: Every day | ORAL | Status: DC

## 2019-06-14 MED ORDER — ASPIRIN EC 81 MG PO TBEC: 81.0000 mg | DELAYED_RELEASE_TABLET | Freq: Every day | ORAL | Status: DC

## 2019-06-14 MED ORDER — HEPARIN BOLUS VIA INFUSION
1500.0000 [IU] | Freq: Once | INTRAVENOUS | Status: AC
  Administered 2019-06-14: 1500 [IU] via INTRAVENOUS

## 2019-06-14 MED ORDER — HEPARIN BOLUS VIA INFUSION
2000.0000 [IU] | Freq: Once | INTRAVENOUS | Status: AC
  Administered 2019-06-14: 2000 [IU] via INTRAVENOUS

## 2019-06-14 MED ORDER — SODIUM CHLORIDE 0.9% FLUSH: 3.0000 mL | INTRAVENOUS | Status: DC | PRN

## 2019-06-14 MED ORDER — POTASSIUM CHLORIDE CRYS ER 20 MEQ PO TBCR
40.0000 meq | EXTENDED_RELEASE_TABLET | Freq: Every day | ORAL | Status: DC
  Administered 2019-06-15: 40 meq via ORAL
  Filled 2019-06-14: qty 2

## 2019-06-14 MED ORDER — METOPROLOL TARTRATE 12.5 MG HALF TABLET
12.5000 mg | ORAL_TABLET | Freq: Two times a day (BID) | ORAL | Status: DC
  Administered 2019-06-14: 12.5 mg via ORAL
  Filled 2019-06-14: qty 1

## 2019-06-14 MED ORDER — ASPIRIN 81 MG PO CHEW
81.0000 mg | CHEWABLE_TABLET | ORAL | Status: AC
  Administered 2019-06-15: 81 mg via ORAL

## 2019-06-14 MED ORDER — CARVEDILOL 3.125 MG PO TABS
3.1250 mg | ORAL_TABLET | Freq: Two times a day (BID) | ORAL | Status: DC
  Administered 2019-06-14 – 2019-06-15 (×3): 3.125 mg via ORAL
  Filled 2019-06-14 (×3): qty 1

## 2019-06-14 MED ORDER — LOSARTAN POTASSIUM 25 MG PO TABS
12.5000 mg | ORAL_TABLET | Freq: Every day | ORAL | Status: DC
  Administered 2019-06-15: 12.5 mg via ORAL
  Filled 2019-06-14: qty 1

## 2019-06-14 MED ORDER — ASPIRIN 81 MG PO CHEW
81.0000 mg | CHEWABLE_TABLET | Freq: Every day | ORAL | Status: DC | PRN
  Filled 2019-06-14: qty 1

## 2019-06-14 MED ORDER — ASPIRIN 300 MG RE SUPP: 300.0000 mg | RECTAL | Status: DC

## 2019-06-14 MED ORDER — ALBUTEROL SULFATE HFA 108 (90 BASE) MCG/ACT IN AERS
2.0000 | INHALATION_SPRAY | Freq: Four times a day (QID) | RESPIRATORY_TRACT | Status: DC | PRN
  Filled 2019-06-14: qty 6.7

## 2019-06-14 MED ORDER — SODIUM CHLORIDE 0.9 % IV SOLN: 250.0000 mL | INTRAVENOUS | Status: DC | PRN

## 2019-06-14 MED ORDER — NITROGLYCERIN 0.4 MG SL SUBL: 0.4000 mg | SUBLINGUAL_TABLET | SUBLINGUAL | Status: DC | PRN

## 2019-06-14 MED ORDER — ONDANSETRON HCL 4 MG/2ML IJ SOLN: 4.0000 mg | Freq: Four times a day (QID) | INTRAMUSCULAR | Status: DC | PRN

## 2019-06-14 MED ORDER — ACETAMINOPHEN 325 MG PO TABS: 650.0000 mg | ORAL_TABLET | ORAL | Status: DC | PRN

## 2019-06-14 MED ORDER — SODIUM CHLORIDE 0.9% FLUSH
3.0000 mL | Freq: Two times a day (BID) | INTRAVENOUS | Status: DC
  Administered 2019-06-15: 3 mL via INTRAVENOUS

## 2019-06-14 NOTE — Progress Notes (Signed)
ANTICOAGULATION CONSULT NOTE   Pharmacy Consult for Heparin Indication: chest pain/ACS/NSTEMI  No Known Allergies  Patient Measurements: Height: 5\' 6"  (167.6 cm) Weight: 283 lb (128.4 kg) IBW/kg (Calculated) : 59.3 Heparin Dosing Weight: 90 kg  Vital Signs: Temp: 97.8 F (36.6 C) (11/21 1437) Temp Source: Oral (11/21 1437) BP: 114/74 (11/22 0030) Pulse Rate: 84 (11/22 0030)  Labs: Recent Labs    06/21/2019 1507 05/26/2019 1813 06/22/2019 2336  HGB 14.3  --   --   HCT 43.7  --   --   PLT 263  --   --   LABPROT  --  14.1  --   INR  --  1.1  --   HEPARINUNFRC  --   --  0.10*  CREATININE 0.70  --   --   TROPONINIHS 388* 349*  --     Estimated Creatinine Clearance: 92.3 mL/min (by C-G formula based on SCr of 0.7 mg/dL).    Assessment: Pharmacy consulted to dose heparin in patient with NSTEMI.  Patient is not on anticoagulation prior to admission.  Troponin on admission 388  11/22 AM update: Heparin level low No issues per RN Trop flat [phone])  Goal of Therapy:  Heparin level 0.3-0.7 units/ml Monitor platelets by anticoagulation protocol: Yes   Plan:  Heparin 2000 units re-[employer] heparin to 1400 units/hr Re-check heparin level at [place], PharmD, [place] Pharmacist Phone: [phone]

## 2019-06-14 NOTE — ED Notes (Signed)
Pt adamantly refusing CT scan. MD aware.

## 2019-06-14 NOTE — ED Notes (Signed)
ED TO INPATIENT HANDOFF REPORT  ED Nurse Name and Phone #: Bellany Elbaum [phone]  S Name/Age/Gender Nicole Moreno 68 y.o. female Room/Bed: APA07/APA07  Code Status   Code Status: Full Code  Home/SNF/Other Home Patient oriented to: self, place, time and situation Is this baseline? Yes   Triage Complete: Triage complete  Chief Complaint shortness breath  Triage Note Pt states she has been progressively short of breath for the past 3 weeks without fever or significant cough. States within the past few days she would be unable to walk 25 ft without significant difficulty.    Allergies No Known Allergies  Level of Care/Admitting Diagnosis ED Disposition    ED Disposition Condition Comment   Admit  Hospital Area: [facility] [[id number]]  Level of Care: Telemetry Cardiac [103]  Covid Evaluation: Asymptomatic Screening Protocol (No Symptoms)  Diagnosis: NSTEMI (non-ST elevated myocardial infarction) [facility]) [[id number]]  Admitting Physician: Lilyan Gilford [[id number]]  Attending Physician: Lilyan Gilford [[id number]]  Estimated length of stay: past midnight tomorrow  Certification:: I certify this patient will need inpatient services for at least 2 midnights  PT Class (Do Not Modify): Inpatient [101]  PT Acc Code (Do Not Modify): Private [1]       B Medical/Surgery History History reviewed. No pertinent past medical history. History reviewed. No pertinent surgical history.   A IV Location/Drains/Wounds Patient Lines/Drains/Airways Status   Active Line/Drains/Airways    Name:   Placement date:   Placement time:   Site:   Days:   Peripheral IV 06-29-19 Left Antecubital   29-Jun-2019    1505    Antecubital   1          Intake/Output Last 24 hours  Intake/Output Summary (Last 24 hours) at 06/14/2019 1150 Last data filed at 06/14/2019 6160 Gross per 24 hour  Intake -  Output 300 ml  Net -300 ml    Labs/Imaging Results for orders placed or  performed during the hospital encounter of 06-29-2019 (from the past 48 hour(s))  CBC with Differential     Status: Abnormal   Collection Time: 29-Jun-2019  3:07 PM  Result Value Ref Range   WBC 12.8 (H) 4.0 - 10.5 K/uL   RBC 4.73 3.87 - 5.11 MIL/uL   Hemoglobin 14.3 12.0 - 15.0 g/dL   HCT 73.7 10.6 - 26.9 %   MCV 92.4 80.0 - 100.0 fL   MCH 30.2 26.0 - 34.0 pg   MCHC 32.7 30.0 - 36.0 g/dL   RDW 48.5 46.2 - 70.3 %   Platelets 263 150 - 400 K/uL   nRBC 0.0 0.0 - 0.2 %   Neutrophils Relative % 75 %   Neutro Abs 9.9 (H) 1.7 - 7.7 K/uL   Lymphocytes Relative 15 %   Lymphs Abs 1.9 0.7 - 4.0 K/uL   Monocytes Relative 7 %   Monocytes Absolute 0.8 0.1 - 1.0 K/uL   Eosinophils Relative 1 %   Eosinophils Absolute 0.1 0.0 - 0.5 K/uL   Basophils Relative 1 %   Basophils Absolute 0.1 0.0 - 0.1 K/uL   Immature Granulocytes 1 %   Abs Immature Granulocytes 0.06 0.00 - 0.07 K/uL    Comment: Performed at [facility], [address] [place], [place] [address]  Basic metabolic panel     Status: Abnormal   Collection Time: 06-29-19  3:07 PM  Result Value Ref Range   Sodium 140 135 - 145 mmol/L   Potassium 3.5 3.5 - 5.1 mmol/L  Chloride 108 98 - 111 mmol/L   CO2 21 (L) 22 - 32 mmol/L   Glucose, Bld 128 (H) 70 - 99 mg/dL   BUN 17 8 - 23 mg/dL   Creatinine, Ser 0.98 0.44 - 1.00 mg/dL   Calcium 9.1 8.9 - 11.9 mg/dL   GFR calc non Af Amer >60 >60 mL/min   GFR calc Af Amer >60 >60 mL/min   Anion gap 11 5 - 15    Comment: Performed at [facility], [address], [place], [place] [address]  Troponin I (High Sensitivity)     Status: Abnormal   Collection Time: 06/04/2019  3:07 PM  Result Value Ref Range   Troponin I (High Sensitivity) 388 (HH) <18 ng/L    Comment: CRITICAL RESULT CALLED TO, READ BACK BY AND VERIFIED WITH: MENTOR @ 1549 ON [id number] BY HENDERSON L. Performed at [facility], [address] [place], [place] [address]   Brain natriuretic peptide     Status: Abnormal   Collection Time:  06/08/2019  3:07 PM  Result Value Ref Range   B Natriuretic Peptide 465.0 (H) 0.0 - 100.0 pg/mL    Comment: Performed at [facility] [address], [place], [place] [address]  SARS CORONAVIRUS 2 (TAT 6-24 HRS) Nasopharyngeal Nasopharyngeal Swab     Status: None   Collection Time: 06/19/2019  4:18 PM   Specimen: Nasopharyngeal Swab  Result Value Ref Range   SARS Coronavirus 2 NEGATIVE NEGATIVE    Comment: (NOTE) SARS-CoV-2 target nucleic acids are NOT DETECTED. The SARS-CoV-2 RNA is generally detectable in upper and lower respiratory specimens during the acute phase of infection. Negative results do not preclude SARS-CoV-2 infection, do not rule out co-infections with other pathogens, and should not be used as the sole basis for treatment or other patient management decisions. Negative results must be combined with clinical observations, patient history, and epidemiological information. The expected result is Negative. Fact Sheet for Patients: HairSlick.no Fact Sheet for Healthcare Providers: quierodirigir.com This test is not yet approved or cleared by the [place] FDA and  has been authorized for detection and/or diagnosis of SARS-CoV-2 by FDA under an Emergency Use Authorization (EUA). This EUA will remain  in effect (meaning this test can be used) for the duration of the COVID-19 declaration under Section 56 4(b)(1) of the Act, 21 U.S.C. section 360bbb-3(b)(1), unless the authorization is terminated or revoked sooner. Performed at [facility], [address] [place], [place] [address]   Troponin I (High Sensitivity)     Status: Abnormal   Collection Time: 05/25/2019  6:13 PM  Result Value Ref Range   Troponin I (High Sensitivity) 349 (HH) <18 ng/L    Comment: CRITICAL VALUE NOTED.  VALUE IS CONSISTENT WITH PREVIOUSLY REPORTED AND CALLED VALUE. Performed at [facility], [address] [place], [place] [address]    Protime-INR     Status: None   Collection Time: 06/06/2019  6:13 PM  Result Value Ref Range   Prothrombin Time 14.1 11.4 - 15.2 seconds   INR 1.1 0.8 - 1.2    Comment: (NOTE) INR goal varies based on device and disease states. Performed at [facility], [address], [place], [place] [address]   Procalcitonin - Baseline     Status: None   Collection Time: 05/31/2019  6:13 PM  Result Value Ref Range   Procalcitonin <0.10 ng/mL    Comment:        Interpretation: PCT (Procalcitonin) <= 0.5 ng/mL: Systemic infection (  sepsis) is not likely. Local bacterial infection is possible. (NOTE)       Sepsis PCT Algorithm           Lower Respiratory Tract                                      Infection PCT Algorithm    ----------------------------     ----------------------------         PCT < 0.25 ng/mL                PCT < 0.10 ng/mL         Strongly encourage             Strongly discourage   discontinuation of antibiotics    initiation of antibiotics    ----------------------------     -----------------------------       PCT 0.25 - 0.50 ng/mL            PCT 0.10 - 0.25 ng/mL               OR       >80% decrease in PCT            Discourage initiation of                                            antibiotics      Encourage discontinuation           of antibiotics    ----------------------------     -----------------------------         PCT >= 0.50 ng/mL              PCT 0.26 - 0.50 ng/mL               AND        <80% decrease in PCT             Encourage initiation of                                             antibiotics       Encourage continuation           of antibiotics    ----------------------------     -----------------------------        PCT >= 0.50 ng/mL                  PCT > 0.50 ng/mL               AND         increase in PCT                  Strongly encourage                                      initiation of antibiotics    Strongly encourage escalation           of  antibiotics                                     -----------------------------  PCT <= 0.25 ng/mL                                                 OR                                        > 80% decrease in PCT                                     Discontinue / Do not initiate                                             antibiotics Performed at [facility], [address] [place], [place] [address]   Blood gas, arterial     Status: Abnormal   Collection Time: 06/20/2019  6:58 PM  Result Value Ref Range   FIO2 100.00    pH, Arterial 7.296 (L) 7.350 - 7.450   pCO2 arterial 44.5 32.0 - 48.0 mmHg   pO2, Arterial 248 (H) 83.0 - 108.0 mmHg   Bicarbonate 20.4 20.0 - 28.0 mmol/L   Acid-base deficit 4.4 (H) 0.0 - 2.0 mmol/L   O2 Saturation 99.0 %   Patient temperature 38.0    Allens test (pass/fail) PASS PASS    Comment: Performed at [facility], [address] [place], [place] [address]  Heparin level (unfractionated)     Status: Abnormal   Collection Time: 06/12/2019 11:36 PM  Result Value Ref Range   Heparin Unfractionated 0.10 (L) 0.30 - 0.70 IU/mL    Comment: (NOTE) If heparin results are below expected values, and patient dosage has  been confirmed, suggest follow up testing of antithrombin III levels. Performed at [facility], [address] [place], [place] [address]   Troponin I (High Sensitivity)     Status: Abnormal   Collection Time: 06/12/2019 11:36 PM  Result Value Ref Range   Troponin I (High Sensitivity) 3,101 (HH) <18 ng/L    Comment: CRITICAL RESULT CALLED TO, READ BACK BY AND VERIFIED WITH: SAPPELT,J @ 0055 ON 06/14/19 BY JUW Performed at [facility], [address] [place], [place] [address]   CBC     Status: Abnormal   Collection Time: 06/14/19  3:44 AM  Result Value Ref Range   WBC 15.0 (H) 4.0 - 10.5 K/uL   RBC 5.03 3.87 - 5.11 MIL/uL   Hemoglobin 14.9 12.0 - 15.0 g/dL   HCT 29.546.8 (H) 62.136.0 - 30.846.0 %   MCV 93.0 80.0 - 100.0  fL   MCH 29.6 26.0 - 34.0 pg   MCHC 31.8 30.0 - 36.0 g/dL   RDW 65.713.1 84.611.5 - 96.215.5 %   Platelets 277 150 - 400 K/uL   nRBC 0.0 0.0 - 0.2 %    Comment: Performed at [facility], [address] [place], [place] [address]  Basic metabolic panel     Status: Abnormal   Collection Time: 06/14/19  3:44 AM  Result Value Ref Range   Sodium 140 135 - 145 mmol/L   Potassium 3.5 3.5 - 5.1 mmol/L  Chloride 105 98 - 111 mmol/L   CO2 22 22 - 32 mmol/L   Glucose, Bld 148 (H) 70 - 99 mg/dL   BUN 15 8 - 23 mg/dL   Creatinine, Ser 1.61 0.44 - 1.00 mg/dL   Calcium 9.5 8.9 - 09.6 mg/dL   GFR calc non Af Amer >60 >60 mL/min   GFR calc Af Amer >60 >60 mL/min   Anion gap 13 5 - 15    Comment: Performed at [facility], [address], [place], [place] [address]  Troponin I (High Sensitivity)     Status: Abnormal   Collection Time: 06/14/19  3:44 AM  Result Value Ref Range   Troponin I (High Sensitivity) 6,033 (HH) <18 ng/L    Comment: CRITICAL RESULT CALLED TO, READ BACK BY AND VERIFIED WITH: PRUITT,G @ 0440 ON 06/14/19 BY JUW Performed at [facility], [address], [place], [place] [address]   Heparin level (unfractionated)     Status: Abnormal   Collection Time: 06/14/19  8:24 AM  Result Value Ref Range   Heparin Unfractionated 0.25 (L) 0.30 - 0.70 IU/mL    Comment: (NOTE) If heparin results are below expected values, and patient dosage has  been confirmed, suggest follow up testing of antithrombin III levels. Performed at [facility], [address], [place], [place] [address]   Troponin I (High Sensitivity)     Status: Abnormal   Collection Time: 06/14/19  8:24 AM  Result Value Ref Range   Troponin I (High Sensitivity) 5,781 (HH) <18 ng/L    Comment: CRITICAL RESULT CALLED TO, READ BACK BY AND VERIFIED WITH: MANDY C.,RN  @0914   06/14/2019 KAY (NOTE) Elevated high sensitivity troponin I (hsTnI) values and significant  changes across serial measurements may suggest ACS but many other   chronic and acute conditions are known to elevate hsTnI results.  Refer to the Links section for chest pain algorithms and additional  guidance. Performed at [facility], [address], [place], [place]   Troponin I (High Sensitivity)     Status: Abnormal   Collection Time: 06/14/19 10:43 AM  Result Value Ref Range   Troponin I (High Sensitivity) 4,890 (HH) <18 ng/L    Comment: CRITICAL RESULT CALLED TO, READ BACK BY AND VERIFIED WITH: CRUISE @ 1137 ON 06/20/2019 BY HENDERSON L. Performed at [facility], [address], [place], [place]    Dg Chest 2 View  Result Date: 05/26/2019 CLINICAL DATA:  Worsening dyspnea EXAM: CHEST - 2 VIEW COMPARISON:  None. FINDINGS: Borderline enlargement of the cardiopericardial silhouette. Otherwise normal mediastinal contour. No pneumothorax. Trace right pleural effusion. No left pleural effusion. Diffuse linear and hazy parahilar lung opacities. IMPRESSION: 1. Diffuse linear and hazy parahilar lung opacities, favor pulmonary edema given borderline mild enlargement of the cardiopericardial silhouette, with atypical infection on the differential. 2. Trace right pleural effusion. Electronically Signed   By: 06/22/2019 M.D.   On: 06/20/2019 15:47   Dg Chest Portable 1 View  Result Date: 06/11/2019 CLINICAL DATA:  Worsening shortness of breath. EXAM: PORTABLE CHEST 1 VIEW COMPARISON:  05/28/2019 at 3:21 a.m. FINDINGS: Lung opacities have increased compared to the earlier exam. There are interstitial and hazy airspace opacities bilaterally, centrally predominant. Lung base opacity partially obscures the hemidiaphragms suggesting small effusions. IMPRESSION: 1. Worsened lung aeration compared to the earlier exam. Increased interstitial and hazy airspace lung opacities. Pulmonary edema favored as the etiology. Electronically Signed   By: 06/03/2019.D.  On: 06/10/2019 18:38    Pending Labs Unresulted Labs (From admission, onward)     Start     Ordered   05/31/2019 0500  AMBMP  Tomorrow morning,   R     06/14/19 1013   05/26/2019 0500  AMCBC  Tomorrow morning,   R     06/14/19 1013   05/28/2019 0500  Magnesium  Tomorrow morning,   R     06/14/19 1013   06/14/19 1600  Heparin level (unfractionated)  Once-Timed,   STAT     06/14/19 0937   06/14/19 0500  CBC  Daily,   R     06/08/2019 1708   05/26/2019 2225  HIV Antibody (routine testing w rflx)  (HIV Antibody (Routine testing w reflex) panel)  Once,   STAT     05/26/2019 2224          Vitals/Pain Today's Vitals   06/14/19 0838 06/14/19 1000 06/14/19 1010 06/14/19 1100  BP:  111/66    Pulse:  83  80  Resp:  17  17  Temp:      TempSrc:      SpO2:  96%  96%  Weight:      Height:      PainSc: 0-No pain  0-No pain     Isolation Precautions No active isolations  Medications Medications  iohexol (OMNIPAQUE) 350 MG/ML injection 100 mL (has no administration in time range)  heparin ADULT infusion 100 units/mL (25000 units/232mL sodium chloride 0.45%) (1,600 Units/hr Intravenous Rate/Dose Change 06/14/19 0958)  acetaminophen (TYLENOL) tablet 650 mg (has no administration in time range)    Or  acetaminophen (TYLENOL) suppository 650 mg (has no administration in time range)  ondansetron (ZOFRAN) tablet 4 mg (has no administration in time range)    Or  ondansetron (ZOFRAN) injection 4 mg (has no administration in time range)  polyethylene glycol (MIRALAX / GLYCOLAX) packet 17 g (has no administration in time range)  albuterol (VENTOLIN HFA) 108 (90 Base) MCG/ACT inhaler 1 puff (has no administration in time range)  albuterol (VENTOLIN HFA) 108 (90 Base) MCG/ACT inhaler 2 puff (2 puffs Inhalation Refused 06/14/19 0936)  furosemide (LASIX) injection 40 mg (40 mg Intravenous Given 06/14/19 0832)  metoprolol tartrate (LOPRESSOR) tablet 12.5 mg (12.5 mg Oral Given 06/14/19 0528)  aspirin chewable tablet 324 mg (324 mg Oral Given 05/30/2019 1813)  furosemide (LASIX) injection 60  mg (60 mg Intravenous Given 06/10/2019 1812)  heparin bolus via infusion 4,000 Units (4,000 Units Intravenous Bolus from Bag 05/26/2019 1816)  LORazepam (ATIVAN) injection 1 mg (1 mg Intravenous Given 05/24/2019 1755)  potassium chloride SA (KLOR-CON) CR tablet 40 mEq (40 mEq Oral Given 06/11/2019 2132)  heparin bolus via infusion 2,000 Units (2,000 Units Intravenous Bolus from Bag 06/14/19 0114)  heparin bolus via infusion 1,500 Units (1,500 Units Intravenous Bolus from Bag 06/14/19 0958)    Mobility walks Low fall risk   Focused Assessments    R Recommendations: See Admitting Provider Note  Report given to:   Additional Notes:

## 2019-06-14 NOTE — Progress Notes (Signed)
*  PRELIMINARY RESULTS* Echocardiogram 2D Echocardiogram has been performed.  Leavy Cella 06/14/2019, 11:44 AM

## 2019-06-14 NOTE — Progress Notes (Signed)
ANTICOAGULATION CONSULT NOTE   Pharmacy Consult for Heparin Indication: chest pain/ACS/NSTEMI  No Known Allergies  Patient Measurements: Height: 5' 6.5" (168.9 cm) Weight: 245 lb 3.2 oz (111.2 kg) IBW/kg (Calculated) : 60.45 Heparin Dosing Weight: 90 kg  Vital Signs: Temp: 98.2 F (36.8 C) (11/22 1337) Temp Source: Oral (11/22 1337) BP: 121/68 (11/22 1337) Pulse Rate: 84 (11/22 1337)  Labs: Recent Labs    06/03/2019 1507 06/06/2019 1813 06/22/2019 2336 06/14/19 0344 06/14/19 0824 06/14/19 1043 06/14/19 1344 06/14/19 1554  HGB 14.3  --   --  14.9  --   --   --   --   HCT 43.7  --   --  46.8*  --   --   --   --   PLT 263  --   --  277  --   --   --   --   LABPROT  --  14.1  --   --   --   --   --   --   INR  --  1.1  --   --   --   --   --   --   HEPARINUNFRC  --   --  0.10*  --  0.25*  --   --  0.27*  CREATININE 0.70  --   --  0.69  --   --   --   --   TROPONINIHS 388* 349* 3,101* 5,150* 5,781* 4,890* 5,331*  --     Estimated Creatinine Clearance: 85.9 mL/min (by C-G formula based on SCr of 0.69 mg/dL).    Assessment: Pharmacy consulted to dose heparin in patient with NSTEMI.  Patient is not on anticoagulation prior to admission.  Troponin on admission 388>> 5781  Heparin level slightly subtherapeutic at 0.27   Goal of Therapy:  Heparin level 0.3-0.7 units/ml Monitor platelets by anticoagulation protocol: Yes   Plan:  Heparin to 1750 units / hr Follow up AM labs  Thank you Anette Guarneri, PharmD  06/14/2019 4:41 PM

## 2019-06-14 NOTE — ED Notes (Signed)
Date and time results received: 06/14/19 9:15 AM  (use smartphrase ".now" to insert current time)  Test: Troponin Critical Value: 5781  Name of Provider Notified: Manuella Ghazi  Orders Received? Or Actions Taken?: Orders Received - See Orders for details

## 2019-06-14 NOTE — H&P (Addendum)
History & Physical    Patient ID: Nicole Moreno MRN: [id number], DOB/AGE: 10/17/1950   Admit date: 06/17/2019  Primary Physician: Patient, No Pcp Per Primary Cardiologist: New to [facility]   Patient Profile    Nicole Moreno is a 68 year old female with no known medical history who presented to [facility] ED with a 2 to 3-week history of shortness of breath found to have an elevated HsT now with transfer to [facility] for further cardiac evaluation.  Past Medical History   History reviewed. No pertinent past medical history.  History reviewed. No pertinent surgical history.   Allergies  No Known Allergies  History of Present Illness    [name] is a 68 year old female with a history as stated above who presented to [facility] on 06/14/2019 with a 3-week history of progressively worsening shortness of breath.  Patient reports that she had 1 single episode of midsternal chest pain that was fleeting thereafter, she began noticing worsening shortness of breath with orthopnea symptoms.  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  Reports she has no known history of CAD, hypertension, DM 2 or HLD.  Has family history of coronary artery disease in her father with CABG x2.  Hypertension in her mother, father and sister.  Has a remote history of tobacco use however has not used in many years.    On [facility] arrival, she was initially found to be tachycardic and tachypneic with O2 saturations at 88% on room air.  Given this, patient was placed on BiPAP ventilation. Covid testing was negative. CXR with diffuse, hazy perihilar lung opacities with pulmonary edema.  Repeat portable CXR with worsening lung aeration compared to prior study.  Patient's BNP was elevated at 465.  She was given 60 mg IV Lasix x1.  CTA was recommended however does not  appear this was performed.  In speaking with the patient, she states that she was unable to lay flat for CTA therefore this test was aborted.  Case discussed with cardiology DOD from 06/14/2019.  Given her presenting symptoms as well as elevated troponin and ST/T wave changes on EKG plan was to obtain CT to rule out PE.  If this was found to be negative, likely ACS as the etiology and therefore transferred to [facility] for further cardiac evaluation for NSTEMI would be necessary.  Plan was for IV Lasix given CXR results and IV heparin.    On my interview, patient is pain-free and her breathing is much improved.  Discussed echocardiogram findings with LVEF at 30 to 35%.   Home Medications    Prior to Admission medications   Medication Sig Start Date End Date Taking? Authorizing Provider  aspirin 81 MG chewable tablet Chew 81 mg by mouth daily as needed for mild pain or moderate pain.   Yes [provider]  ibuprofen (ADVIL) 200 MG tablet Take 200 mg by mouth every 6 (six) hours as needed for fever or moderate pain.   Yes [provider]    Family History    Family History  Problem Relation Age of Onset  . CAD Father   . CVA Father   . Cancer Sister   . Cancer Brother     Social History    Social History   Socioeconomic History  . Marital status: Married  Spouse name: Not on file  . Number of children: Not on file  . Years of education: Not on file  . Highest education level: Not on file  Occupational History  . Not on file  Social Needs  . Financial resource strain: Not on file  . Food insecurity    Worry: Not on file    Inability: Not on file  . Transportation needs    Medical: Not on file    Non-medical: Not on file  Tobacco Use  . Smoking status: Former Smoker    Quit date: 07/2004    Years since quitting: 14.9  . Smokeless tobacco: Never Used  Substance and Sexual Activity  . Alcohol use: Never    Frequency: Never  . Drug use: Never  . Sexual  activity: Not on file  Lifestyle  . Physical activity    Days per week: Not on file    Minutes per session: Not on file  . Stress: Not on file  Relationships  . Social Musicianconnections    Talks on phone: Not on file    Gets together: Not on file    Attends religious service: Not on file    Active member of club or organization: Not on file    Attends meetings of clubs or organizations: Not on file    Relationship status: Not on file  . Intimate partner violence    Fear of current or ex partner: Not on file    Emotionally abused: Not on file    Physically abused: Not on file    Forced sexual activity: Not on file  Other Topics Concern  . Not on file  Social History Narrative  . Not on file     Review of Systems   See HPI All other systems reviewed and are otherwise negative except as noted above.  Physical Exam    Blood pressure 121/68, pulse 84, temperature 98.2 F (36.8 C), temperature source Oral, resp. rate 16, height 5' 6.5" (1.689 m), weight 111.2 kg, SpO2 97 %.   General: Overweight, NAD Neck: Negative for carotid bruits. No JVD Lungs: Bilateral middle and lower lobe crackles. Breathing is unlabored. Cardiovascular: RRR with S1 S2. No murmurs Abdomen: Soft, non-tender, non-distended. No obvious abdominal masses. Extremities: No edema. No clubbing or cyanosis. PT pulses 2+ bilaterally Neuro: Alert and oriented. No focal deficits. No facial asymmetry. MAE spontaneously. Psych: Responds to questions appropriately with normal affect.    Labs    Troponin (Point of Care Test) No results for input(s): TROPIPOC in the last 72 hours. No results for input(s): CKTOTAL, CKMB, TROPONINI in the last 72 hours. Lab Results  Component Value Date   WBC 15.0 (H) 06/14/2019   HGB 14.9 06/14/2019   HCT 46.8 (H) 06/14/2019   MCV 93.0 06/14/2019   PLT 277 06/14/2019    Recent Labs  Lab 06/14/19 0344  NA 140  K 3.5  CL 105  CO2 22  BUN 15  CREATININE 0.69  CALCIUM 9.5   GLUCOSE 148*   No results found for: CHOL, HDL, LDLCALC, TRIG No results found for: [facility]   Radiology Studies    Dg Chest 2 View  Result Date: 06/12/2019 CLINICAL DATA:  Worsening dyspnea EXAM: CHEST - 2 VIEW COMPARISON:  None. FINDINGS: Borderline enlargement of the cardiopericardial silhouette. Otherwise normal mediastinal contour. No pneumothorax. Trace right pleural effusion. No left pleural effusion. Diffuse linear and hazy parahilar lung opacities. IMPRESSION: 1. Diffuse linear and hazy parahilar lung opacities, favor  pulmonary edema given borderline mild enlargement of the cardiopericardial silhouette, with atypical infection on the differential. 2. Trace right pleural effusion. Electronically Signed   By: Delbert Phenix M.D.   On: 05/29/2019 15:47   Dg Chest Portable 1 View  Result Date: 06/17/2019 CLINICAL DATA:  Worsening shortness of breath. EXAM: PORTABLE CHEST 1 VIEW COMPARISON:  05/24/2019 at 3:21 a.m. FINDINGS: Lung opacities have increased compared to the earlier exam. There are interstitial and hazy airspace opacities bilaterally, centrally predominant. Lung base opacity partially obscures the hemidiaphragms suggesting small effusions. IMPRESSION: 1. Worsened lung aeration compared to the earlier exam. Increased interstitial and hazy airspace lung opacities. Pulmonary edema favored as the etiology. Electronically Signed   By: Amie Portland M.D.   On: 06/07/2019 18:38   ECG & Cardiac Imaging    Echocardiogram 06/14/2019:   1. Left ventricular ejection fraction, by visual estimation, is 30 to 35%. The left ventricle has mild to moderately decreased function. There is moderately increased left ventricular hypertrophy.  2. Elevated left atrial pressure.  3. Apex, nteroseptal and anterior walls are hypokinetic.  4. Global right ventricle has normal systolic function.The right ventricular size is normal. No increase in right ventricular wall thickness.  5. Left atrial size was  moderately dilated.  6. Right atrial size was normal.  7. Small pericardial effusion.  8. The pericardial effusion is circumferential.  9. Moderate aortic valve annular calcification. 10. The mitral valve is normal in structure. Mild mitral valve regurgitation. No evidence of mitral stenosis. 11. The tricuspid valve is normal in structure. Tricuspid valve regurgitation is not demonstrated. 12. The aortic valve is tricuspid. Aortic valve regurgitation is not visualized. No evidence of aortic valve sclerosis or stenosis. 13. There is Mild calcification of the aortic valve. 14. There is Moderate thickening of the aortic valve. 15. The pulmonic valve was not well visualized. Pulmonic valve regurgitation is not visualized. 16. The interatrial septum was not well visualized  Assessment & Plan    1.  NSTEMI: -Patient presented to [facility] with 3-week history of chest pain.  EKG with.  HST found to be elevated at 3101, 6033, 5781, 4890.  CXR with diffuse linear, hazy perihilar lung opacities favoring pulmonary edema given borderline mild enlargement of the cardiopericardial silhouette.  Repeat CXR with worsening lung aeration compared to earlier study.  BNP found to be elevated 465.  Patient given 60 mg of IV Lasix x1 with net negative 300 mL of UO. -Echocardiogram performed patient was found to have reduced LV function at 30 to 35% with moderately increased LVH.  Apex, anteroseptal and anterior walls were noted to be hypokinetic. -Given this, plan is for cardiac catheterization for further cardiac evaluation tomorrow versus Tuesday -We will risk stratify with hemoglobin A1c, lipid panel -Change metoprolol to carvedilol 3.125 mg given low normal BP -Continue heparin GTT for ACS -Add low-dose losartan given reduced LV function -Keep n.p.o. after midnight  2.  Presumed ischemic cardiomyopathy: -Plan for further ischemic evaluation with cardiac cath as above -Echocardiogram with reduced LV function at 30  to 35% -Plan for HF guided medication therapies including low-dose ARB, beta-blocker with eventual plan to transition to Entresto -Continue IV Lasix 40 mg twice daily -We will add potassium supplementation at 40 mill equivalent daily -Monitor renal function closely   Severity of Illness: The appropriate patient status for this patient is INPATIENT. Inpatient status is judged to be reasonable and necessary in order to provide the required intensity of service to ensure the patient's  safety. The patient's presenting symptoms, physical exam findings, and initial radiographic and laboratory data in the context of their chronic comorbidities is felt to place them at high risk for further clinical deterioration. Furthermore, it is not anticipated that the patient will be medically stable for discharge from the hospital within 2 midnights of admission. The following factors support the patient status of inpatient.   " The patient's presenting symptoms include shortness of breath/chest pain. " The worrisome physical exam findings include elevated troponin. " The initial radiographic and laboratory data are worrisome because of abnormal CXR. " The chronic co-morbidities include obesity.   I certify that at the point of admission it is my clinical judgment that the patient will require inpatient hospital care spanning beyond 2 midnights from the point of admission due to high intensity of service, high risk for further deterioration and high frequency of surveillance required     Signed, Georgie Chard NP-C [facility] Pager: [phone] 06/14/2019, @NOW   Attending Note:   The patient was seen and examined.  Agree with assessment and plan as noted above.  Changes made to the above note as needed.  Patient seen and independently examined with  , NP .   We discussed all aspects of the encounter. I agree with the assessment and plan as stated above.  1.   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  Is diuresing well We have started coreg 3.125 BID Losartan 12.5 mg a day  With plans to transition to [facility] once we know her BP tolerates the new meds.  Cont Lasix Add Aldactone in a day or so  She will need a cath   2.  Morbid obesity:   Advised diet , exercise and weight loss    I have spent a total of 40 minutes with patient reviewing hospital  notes , telemetry, EKGs, labs and examining patient as well as establishing an assessment and plan that was discussed with the patient. > 50% of time was spent in direct patient care.    [facility], Vesta Mixer., MD, [facility] 06/14/2019, 3:25 PM [address]  [address] Office [phone] Pager [phone]

## 2019-06-14 NOTE — ED Notes (Signed)
CRITICAL VALUE ALERT  Critical Value:  Troponin 6033 Date & Time Notied: 06/14/19 @ 3382 Provider Notified: Dr Remi Haggard Orders Received/Actions taken: None yet

## 2019-06-14 NOTE — ED Notes (Signed)
Date and time results received: 06/14/19 11:37 AM  (use smartphrase ".now" to insert current time)  Test: Troponin Critical Value: 4890  Name of Provider Notified: Manuella Ghazi  Orders Received? Or Actions Taken?: Orders Received - See Orders for details

## 2019-06-14 NOTE — ED Notes (Signed)
Date and time results received: 06/14/19 0053   Test: TROPONIN Critical Value: 3101  Name of Provider Notified: Remi Haggard, MD

## 2019-06-14 NOTE — ED Notes (Signed)
Pt requested for her O2 to be removed for a break. Will monitor sats.

## 2019-06-14 NOTE — Progress Notes (Signed)
ANTICOAGULATION CONSULT NOTE   Pharmacy Consult for Heparin Indication: chest pain/ACS/NSTEMI  No Known Allergies  Patient Measurements: Height: 5\' 6"  (167.6 cm) Weight: 283 lb (128.4 kg) IBW/kg (Calculated) : 59.3 Heparin Dosing Weight: 90 kg  Vital Signs: BP: 117/75 (11/22 0800) Pulse Rate: 81 (11/22 0800)  Labs: Recent Labs    06-19-19 1507 06-19-2019 1813 06/19/2019 2336 06/14/19 0344 06/14/19 0824  HGB 14.3  --   --  14.9  --   HCT 43.7  --   --  46.8*  --   PLT 263  --   --  277  --   LABPROT  --  14.1  --   --   --   INR  --  1.1  --   --   --   HEPARINUNFRC  --   --  0.10*  --  0.25*  CREATININE 0.70  --   --  0.69  --   TROPONINIHS 388* 349* 3,101* 6,033* 5,781*    Estimated Creatinine Clearance: 92.3 mL/min (by C-G formula based on SCr of 0.69 mg/dL).    Assessment: Pharmacy consulted to dose heparin in patient with NSTEMI.  Patient is not on anticoagulation prior to admission.  Troponin on admission 388>> 5781  Heparin level slightly subtherapeutic at 0.25   Goal of Therapy:  Heparin level 0.3-0.7 units/ml Monitor platelets by anticoagulation protocol: Yes   Plan:  Heparin 1500 units re-bol[employer] heparin to 1600 units/hr Recheck heparin level in 6-8 hours and daily  Margot Ables, PharmD Clinical Pharmacist 06/14/2019 9:36 AM

## 2019-06-14 NOTE — Progress Notes (Signed)
PROGRESS NOTE    Nicole Moreno  ZOX:[id number] DOB: 1950/12/16 DOA: 06/09/2019 PCP: Patient, No Pcp Per   Brief Narrative:  Per HPI: Nicole Moreno is a 68 y.o. female with no known medical history significant presented to the Ed with c/o chest pain and difficulty breathing of 3 weeks duration. Difficulty breathing was especially worse with exertion, over the past week. Chest pain- non radiating also present with exertion and improves with rest. On my initial evaluation patient was on BIPAP and mildly lethargic.  She answers a few questions by nodding, but most of the history was obtained from chart review and ED provider. But she denied cough and COVID positive contacts, no fevers, she also denies weight gain. Father had heart disease at age of 68.  11/22: Patient has been admitted with acute hypoxemic respiratory failure likely related to CHF and pulmonary edema as well as NSTEMI.  CTA of the chest was ordered to rule out PE, however patient refused to lie flat on the table for the procedure and this has been canceled.  She is currently on room air and has no shortness of breath or chest pain noted.  Covid testing has been negative.  She has been started on heparin drip and troponins have elevated to 6033 with most recent noted to be 5781.  2D echocardiogram is ordered and pending.  She is currently stable [facility] and I have discussed the case with Dr. Elease HashimotoNahser who agrees to take the patient to cardiology service with anticipated cardiac catheterization in a.m.  We will keep n.p.o. after midnight and order a.m. labs.  Assessment & Plan:   Active Problems:   Acute respiratory failure (HCC)   NSTEMI (non-ST elevated myocardial infarction) (HCC)  NSTEMI -Significant troponin elevations noted with peak of 6033 which now appears to be downtrending -Discussed case with Dr. Elease HashimotoNahser who agrees to [facility] and transfer to cardiology service with  anticipated cardiac cath in a.m.  We will keep n.p.o. after midnight. -Continue heparin drip -Continue on metoprolol as ordered -Repeat EKG in a.m. -Continue troponin trend -No chest pain or shortness of breath currently noted  Acute hypoxemic respiratory failure secondary to acute CHF with pulmonary edema related to above -Currently improved and patient is on room air -2D echocardiogram ordered and pending -Continue Lasix IV twice daily and monitor I's and O's and a.m. labs -Aspirin 325 mg given in ED -Patient refuses CT angiogram for PE study -Covid testing negative  DVT prophylaxis: Heparin drip Code Status: Full Family Communication: None at bedside Disposition Plan: [facility] with anticipated cardiac catheterization.  Procedures:   None  Antimicrobials:   None   Subjective: Patient seen and evaluated today with no new acute complaints or concerns. No acute concerns or events noted overnight.  Patient is agreeable to [facility].  She denies any chest pain or shortness of breath currently.  Objective: Vitals:   06/14/19 0700 06/14/19 0730 06/14/19 0745 06/14/19 0800  BP: 122/66 (!) 109/56  117/75  Pulse: 78 78 78 81  Resp: 19 15 14  (!) 27  Temp:      TempSrc:      SpO2: 97% 97% 95% 97%  Weight:      Height:        Intake/Output Summary (Last 24 hours) at 06/14/2019 1004 Last data filed at 06/14/2019 0733 Gross per 24 hour  Intake -  Output 300 ml  Net -300 ml   Filed Weights   Jul 01, 2019 1438  Weight: 128.4 kg    Examination:  General exam: Appears calm and comfortable  Respiratory system: Clear to auscultation. Respiratory effort normal. Cardiovascular system: S1 & S2 heard, RRR. No JVD, murmurs, rubs, gallops or clicks. No pedal edema. Gastrointestinal system: Abdomen is nondistended, soft and nontender. No organomegaly or masses felt. Normal bowel sounds heard. Central nervous system: Alert and oriented.  No focal neurological deficits. Extremities: Symmetric 5 x 5 power. Skin: No rashes, lesions or ulcers Psychiatry: Judgement and insight appear normal. Mood & affect appropriate.     Data Reviewed: I have personally reviewed following labs and imaging studies  CBC: Recent Labs  Lab July 01, 2019 1507 06/14/19 0344  WBC 12.8* 15.0*  NEUTROABS 9.9*  --   HGB 14.3 14.9  HCT 43.7 46.8*  MCV 92.4 93.0  PLT 263 314   Basic Metabolic Panel: Recent Labs  Lab July 01, 2019 1507 06/14/19 0344  NA 140 140  K 3.5 3.5  CL 108 105  CO2 21* 22  GLUCOSE 128* 148*  BUN 17 15  CREATININE 0.70 0.69  CALCIUM 9.1 9.5   GFR: Estimated Creatinine Clearance: 92.3 mL/min (by C-G formula based on SCr of 0.69 mg/dL). Liver Function Tests: No results for input(s): AST, ALT, ALKPHOS, BILITOT, PROT, ALBUMIN in the last 168 hours. No results for input(s): LIPASE, AMYLASE in the last 168 hours. No results for input(s): AMMONIA in the last 168 hours. Coagulation Profile: Recent Labs  Lab 07-01-2019 1813  INR 1.1   Cardiac Enzymes: No results for input(s): CKTOTAL, CKMB, CKMBINDEX, TROPONINI in the last 168 hours. BNP (last 3 results) No results for input(s): PROBNP in the last 8760 hours. HbA1C: No results for input(s): HGBA1C in the last 72 hours. CBG: No results for input(s): GLUCAP in the last 168 hours. Lipid Profile: No results for input(s): CHOL, HDL, LDLCALC, TRIG, CHOLHDL, LDLDIRECT in the last 72 hours. Thyroid Function Tests: No results for input(s): TSH, T4TOTAL, FREET4, T3FREE, THYROIDAB in the last 72 hours. Anemia Panel: No results for input(s): VITAMINB12, FOLATE, FERRITIN, TIBC, IRON, RETICCTPCT in the last 72 hours. Sepsis Labs: Recent Labs  Lab 2019-07-01 1813  PROCALCITON <0.10    Recent Results (from the past 240 hour(s))  SARS CORONAVIRUS 2 (TAT 6-24 HRS) Nasopharyngeal Nasopharyngeal Swab     Status: None   Collection Time: Jul 01, 2019  4:18 PM   Specimen: Nasopharyngeal  Swab  Result Value Ref Range Status   SARS Coronavirus 2 NEGATIVE NEGATIVE Final    Comment: (NOTE) SARS-CoV-2 target nucleic acids are NOT DETECTED. The SARS-CoV-2 RNA is generally detectable in upper and lower respiratory specimens during the acute phase of infection. Negative results do not preclude SARS-CoV-2 infection, do not rule out co-infections with other pathogens, and should not be used as the sole basis for treatment or other patient management decisions. Negative results must be combined with clinical observations, patient history, and epidemiological information. The expected result is Negative. Fact Sheet for Patients: SugarRoll.be Fact Sheet for Healthcare Providers: https://www.woods-mathews.com/ This test is not yet approved or cleared by the [place] FDA and  has been authorized for detection and/or diagnosis of SARS-CoV-2 by FDA under an Emergency Use Authorization (EUA). This EUA will remain  in effect (meaning this test can be used) for the duration of the COVID-19 declaration under Section 56 4(b)(1) of the Act, 21 U.S.C. section 360bbb-3(b)(1), unless the authorization is terminated or revoked sooner. Performed at [facility]  Lab, [address] [place], [place] [address]          Radiology Studies: Dg Chest 2 View  Result Date: June 27, 2019 CLINICAL DATA:  Worsening dyspnea EXAM: CHEST - 2 VIEW COMPARISON:  None. FINDINGS: Borderline enlargement of the cardiopericardial silhouette. Otherwise normal mediastinal contour. No pneumothorax. Trace right pleural effusion. No left pleural effusion. Diffuse linear and hazy parahilar lung opacities. IMPRESSION: 1. Diffuse linear and hazy parahilar lung opacities, favor pulmonary edema given borderline mild enlargement of the cardiopericardial silhouette, with atypical infection on the differential. 2. Trace right pleural effusion. Electronically Signed   By: Delbert Phenix M.D.   On: Jun 27, 2019 15:47   Dg Chest Portable 1 View  Result Date: 06-27-19 CLINICAL DATA:  Worsening shortness of breath. EXAM: PORTABLE CHEST 1 VIEW COMPARISON:  06/27/2019 at 3:21 a.m. FINDINGS: Lung opacities have increased compared to the earlier exam. There are interstitial and hazy airspace opacities bilaterally, centrally predominant. Lung base opacity partially obscures the hemidiaphragms suggesting small effusions. IMPRESSION: 1. Worsened lung aeration compared to the earlier exam. Increased interstitial and hazy airspace lung opacities. Pulmonary edema favored as the etiology. Electronically Signed   By: Amie Portland M.D.   On: 2019/06/27 18:38        Scheduled Meds: . albuterol  2 puff Inhalation Q6H  . furosemide  40 mg Intravenous Q12H  . metoprolol tartrate  12.5 mg Oral BID   Continuous Infusions: . heparin 1,600 Units/hr (06/14/19 0958)     LOS: 1 day    Time spent: 30 minutes    Nikyah Lackman Hoover Brunette, DO [facility] Pager [phone]  If 7PM-7AM, please contact night-coverage [email or web address] Password [facility] 06/14/2019, 10:04 AM

## 2019-06-15 ENCOUNTER — Encounter (HOSPITAL_COMMUNITY): Admission: EM | Disposition: E | Payer: Self-pay | Source: Home / Self Care | Attending: Surgery

## 2019-06-15 ENCOUNTER — Inpatient Hospital Stay (HOSPITAL_COMMUNITY): Payer: Medicare Other

## 2019-06-15 DIAGNOSIS — I25110 Atherosclerotic heart disease of native coronary artery with unstable angina pectoris: Secondary | ICD-10-CM

## 2019-06-15 DIAGNOSIS — I214 Non-ST elevation (NSTEMI) myocardial infarction: Secondary | ICD-10-CM

## 2019-06-15 DIAGNOSIS — Z01810 Encounter for preprocedural cardiovascular examination: Secondary | ICD-10-CM

## 2019-06-15 DIAGNOSIS — I509 Heart failure, unspecified: Secondary | ICD-10-CM

## 2019-06-15 DIAGNOSIS — I5021 Acute systolic (congestive) heart failure: Secondary | ICD-10-CM

## 2019-06-15 DIAGNOSIS — I420 Dilated cardiomyopathy: Secondary | ICD-10-CM

## 2019-06-15 DIAGNOSIS — E7800 Pure hypercholesterolemia, unspecified: Secondary | ICD-10-CM

## 2019-06-15 DIAGNOSIS — R778 Other specified abnormalities of plasma proteins: Secondary | ICD-10-CM

## 2019-06-15 DIAGNOSIS — E876 Hypokalemia: Secondary | ICD-10-CM

## 2019-06-15 HISTORY — PX: LEFT HEART CATH AND CORONARY ANGIOGRAPHY: CATH118249

## 2019-06-15 LAB — URINALYSIS, ROUTINE W REFLEX MICROSCOPIC
Bilirubin Urine: NEGATIVE
Glucose, UA: NEGATIVE mg/dL
Hgb urine dipstick: NEGATIVE
Ketones, ur: NEGATIVE mg/dL
Leukocytes,Ua: NEGATIVE
Nitrite: NEGATIVE
Protein, ur: NEGATIVE mg/dL
Specific Gravity, Urine: >1.046 — ABNORMAL HIGH (ref 1.005–1.030)
pH: 7 (ref 5.0–8.0)

## 2019-06-15 LAB — COMPREHENSIVE METABOLIC PANEL
ALT: 51 U/L — ABNORMAL HIGH (ref 0–44)
AST: 29 U/L (ref 15–41)
Albumin: 3.4 g/dL — ABNORMAL LOW (ref 3.5–5.0)
Alkaline Phosphatase: 87 U/L (ref 38–126)
Anion gap: 9 (ref 5–15)
BUN: 17 mg/dL (ref 8–23)
CO2: 26 mmol/L (ref 22–32)
Calcium: 9.1 mg/dL (ref 8.9–10.3)
Chloride: 106 mmol/L (ref 98–111)
Creatinine, Ser: 0.72 mg/dL (ref 0.44–1.00)
GFR calc Af Amer: >60 mL/min (ref >60)
GFR calc non Af Amer: >60 mL/min (ref >60)
Glucose, Bld: 146 mg/dL — ABNORMAL HIGH (ref 70–99)
Potassium: 3.7 mmol/L (ref 3.5–5.1)
Sodium: 141 mmol/L (ref 135–145)
Total Bilirubin: 0.9 mg/dL (ref 0.3–1.2)
Total Protein: 6.6 g/dL (ref 6.5–8.1)

## 2019-06-15 LAB — LIPID PANEL
Cholesterol: 257 mg/dL — ABNORMAL HIGH (ref 0–200)
HDL: 37 mg/dL — ABNORMAL LOW (ref >40)
LDL Cholesterol: 171 mg/dL — ABNORMAL HIGH (ref 0–99)
Total CHOL/HDL Ratio: 6.9 RATIO
Triglycerides: 244 mg/dL — ABNORMAL HIGH (ref <150)
VLDL: 49 mg/dL — ABNORMAL HIGH (ref 0–40)

## 2019-06-15 LAB — BASIC METABOLIC PANEL
Anion gap: 11 (ref 5–15)
BUN: 17 mg/dL (ref 8–23)
CO2: 27 mmol/L (ref 22–32)
Calcium: 9.4 mg/dL (ref 8.9–10.3)
Chloride: 104 mmol/L (ref 98–111)
Creatinine, Ser: 0.79 mg/dL (ref 0.44–1.00)
GFR calc Af Amer: >60 mL/min (ref >60)
GFR calc non Af Amer: >60 mL/min (ref >60)
Glucose, Bld: 139 mg/dL — ABNORMAL HIGH (ref 70–99)
Potassium: 3.3 mmol/L — ABNORMAL LOW (ref 3.5–5.1)
Sodium: 142 mmol/L (ref 135–145)

## 2019-06-15 LAB — HEMOGLOBIN A1C
Hgb A1c MFr Bld: 6.6 % — ABNORMAL HIGH (ref 4.8–5.6)
Mean Plasma Glucose: 142.72 mg/dL

## 2019-06-15 LAB — BLOOD GAS, ARTERIAL
Acid-Base Excess: 3 mmol/L — ABNORMAL HIGH (ref 0.0–2.0)
Bicarbonate: 26.6 mmol/L (ref 20.0–28.0)
Drawn by: 57060
FIO2: 21
O2 Saturation: 91.9 %
Patient temperature: 36.7
pCO2 arterial: 37.1 mmHg (ref 32.0–48.0)
pH, Arterial: 7.468 — ABNORMAL HIGH (ref 7.350–7.450)
pO2, Arterial: 59.8 mmHg — ABNORMAL LOW (ref 83.0–108.0)

## 2019-06-15 LAB — CBC
HCT: 41.1 % (ref 36.0–46.0)
Hemoglobin: 13.7 g/dL (ref 12.0–15.0)
MCH: 30.2 pg (ref 26.0–34.0)
MCHC: 33.3 g/dL (ref 30.0–36.0)
MCV: 90.7 fL (ref 80.0–100.0)
Platelets: 237 10*3/uL (ref 150–400)
RBC: 4.53 MIL/uL (ref 3.87–5.11)
RDW: 13.1 % (ref 11.5–15.5)
WBC: 10.2 10*3/uL (ref 4.0–10.5)
nRBC: 0 % (ref 0.0–0.2)

## 2019-06-15 LAB — SURGICAL PCR SCREEN
MRSA, PCR: NEGATIVE
Staphylococcus aureus: POSITIVE — AB

## 2019-06-15 LAB — TYPE AND SCREEN
ABO/RH(D): O POS
Antibody Screen: NEGATIVE

## 2019-06-15 LAB — MAGNESIUM: Magnesium: 2 mg/dL (ref 1.7–2.4)

## 2019-06-15 LAB — PROTIME-INR
INR: 1.1 (ref 0.8–1.2)
Prothrombin Time: 13.7 seconds (ref 11.4–15.2)

## 2019-06-15 LAB — APTT: aPTT: 59 seconds — ABNORMAL HIGH (ref 24–36)

## 2019-06-15 LAB — ABO/RH: ABO/RH(D): O POS

## 2019-06-15 LAB — HEPARIN LEVEL (UNFRACTIONATED): Heparin Unfractionated: 0.32 IU/mL (ref 0.30–0.70)

## 2019-06-15 SURGERY — LEFT HEART CATH AND CORONARY ANGIOGRAPHY
Anesthesia: LOCAL

## 2019-06-15 MED ORDER — ALPRAZOLAM 0.25 MG PO TABS: 0.2500 mg | ORAL_TABLET | ORAL | Status: DC | PRN

## 2019-06-15 MED ORDER — DOPAMINE-DEXTROSE 3.2-5 MG/ML-% IV SOLN
0.0000 ug/kg/min | INTRAVENOUS | Status: AC
  Administered 2019-06-16: 3 ug/kg/min via INTRAVENOUS
  Filled 2019-06-15: qty 250

## 2019-06-15 MED ORDER — CHLORHEXIDINE GLUCONATE CLOTH 2 % EX PADS
6.0000 | MEDICATED_PAD | Freq: Once | CUTANEOUS | Status: AC
  Administered 2019-06-15: 6 via TOPICAL

## 2019-06-15 MED ORDER — MUPIROCIN 2 % EX OINT
1.0000 "application " | TOPICAL_OINTMENT | Freq: Two times a day (BID) | CUTANEOUS | Status: DC
  Administered 2019-06-15 – 2019-06-16 (×2): 1 via NASAL
  Filled 2019-06-15: qty 22

## 2019-06-15 MED ORDER — CHLORHEXIDINE GLUCONATE 0.12 % MT SOLN
15.0000 mL | Freq: Once | OROMUCOSAL | Status: AC
  Administered 2019-06-16: 15 mL via OROMUCOSAL
  Filled 2019-06-15: qty 15

## 2019-06-15 MED ORDER — NITROGLYCERIN IN D5W 200-5 MCG/ML-% IV SOLN
2.0000 ug/min | INTRAVENOUS | Status: AC
  Administered 2019-06-16: 08:00:00 5 ug/min via INTRAVENOUS
  Filled 2019-06-15: qty 250

## 2019-06-15 MED ORDER — TRANEXAMIC ACID (OHS) BOLUS VIA INFUSION
15.0000 mg/kg | INTRAVENOUS | Status: DC
  Filled 2019-06-15: qty 1650

## 2019-06-15 MED ORDER — MAGNESIUM SULFATE 50 % IJ SOLN
40.0000 meq | INTRAMUSCULAR | Status: DC
  Filled 2019-06-15: qty 9.85

## 2019-06-15 MED ORDER — MIDAZOLAM HCL 2 MG/2ML IJ SOLN
INTRAMUSCULAR | Status: AC
  Filled 2019-06-15: qty 2

## 2019-06-15 MED ORDER — SODIUM CHLORIDE 0.9 % IV SOLN: 250.0000 mL | INTRAVENOUS | Status: DC | PRN

## 2019-06-15 MED ORDER — FUROSEMIDE 10 MG/ML IJ SOLN
40.0000 mg | Freq: Two times a day (BID) | INTRAMUSCULAR | Status: DC
  Administered 2019-06-15: 40 mg via INTRAVENOUS
  Filled 2019-06-15: qty 4

## 2019-06-15 MED ORDER — HEPARIN SODIUM (PORCINE) 1000 UNIT/ML IJ SOLN
INTRAMUSCULAR | Status: DC | PRN
  Administered 2019-06-15: 5000 [IU] via INTRAVENOUS

## 2019-06-15 MED ORDER — HEPARIN (PORCINE) 25000 UT/250ML-% IV SOLN
1750.0000 [IU]/h | INTRAVENOUS | Status: DC
  Administered 2019-06-15: 1750 [IU]/h via INTRAVENOUS
  Filled 2019-06-15: qty 250

## 2019-06-15 MED ORDER — VANCOMYCIN HCL 10 G IV SOLR
1500.0000 mg | INTRAVENOUS | Status: AC
  Administered 2019-06-16: 1500 mg via INTRAVENOUS
  Filled 2019-06-15: qty 1500

## 2019-06-15 MED ORDER — DIAZEPAM 5 MG PO TABS
5.0000 mg | ORAL_TABLET | Freq: Once | ORAL | Status: AC
  Administered 2019-06-16: 5 mg via ORAL
  Filled 2019-06-15: qty 1

## 2019-06-15 MED ORDER — METOPROLOL TARTRATE 12.5 MG HALF TABLET
12.5000 mg | ORAL_TABLET | Freq: Once | ORAL | Status: AC
  Administered 2019-06-16: 12.5 mg via ORAL
  Filled 2019-06-15: qty 1

## 2019-06-15 MED ORDER — PHENYLEPHRINE HCL-NACL 20-0.9 MG/250ML-% IV SOLN
30.0000 ug/min | INTRAVENOUS | Status: AC
  Administered 2019-06-16: 25 ug/min via INTRAVENOUS
  Filled 2019-06-15: qty 250

## 2019-06-15 MED ORDER — LABETALOL HCL 5 MG/ML IV SOLN: 10.0000 mg | INTRAVENOUS | Status: AC | PRN

## 2019-06-15 MED ORDER — PLASMA-LYTE 148 IV SOLN
INTRAVENOUS | Status: DC
  Filled 2019-06-15: qty 2.5

## 2019-06-15 MED ORDER — TEMAZEPAM 15 MG PO CAPS
15.0000 mg | ORAL_CAPSULE | Freq: Once | ORAL | Status: AC | PRN
  Administered 2019-06-15: 15 mg via ORAL
  Filled 2019-06-15: qty 1

## 2019-06-15 MED ORDER — ATORVASTATIN CALCIUM 80 MG PO TABS
80.0000 mg | ORAL_TABLET | Freq: Every day | ORAL | Status: DC
  Administered 2019-06-15 – 2019-06-18 (×3): 80 mg via ORAL
  Filled 2019-06-15 (×3): qty 1

## 2019-06-15 MED ORDER — CHLORHEXIDINE GLUCONATE CLOTH 2 % EX PADS: 6.0000 | MEDICATED_PAD | Freq: Every day | CUTANEOUS | Status: DC

## 2019-06-15 MED ORDER — DEXMEDETOMIDINE HCL IN NACL 400 MCG/100ML IV SOLN
0.1000 ug/kg/h | INTRAVENOUS | Status: AC
  Administered 2019-06-16: .2 ug/kg/h via INTRAVENOUS
  Filled 2019-06-15: qty 100

## 2019-06-15 MED ORDER — VERAPAMIL HCL 2.5 MG/ML IV SOLN
INTRAVENOUS | Status: DC | PRN
  Administered 2019-06-15: 10 mL via INTRA_ARTERIAL

## 2019-06-15 MED ORDER — LIDOCAINE HCL (PF) 1 % IJ SOLN
INTRAMUSCULAR | Status: AC
  Filled 2019-06-15: qty 30

## 2019-06-15 MED ORDER — HYDRALAZINE HCL 20 MG/ML IJ SOLN: 10.0000 mg | INTRAMUSCULAR | Status: AC | PRN

## 2019-06-15 MED ORDER — POTASSIUM CHLORIDE CRYS ER 20 MEQ PO TBCR
40.0000 meq | EXTENDED_RELEASE_TABLET | Freq: Once | ORAL | Status: AC
  Administered 2019-06-15: 40 meq via ORAL
  Filled 2019-06-15: qty 2

## 2019-06-15 MED ORDER — HEPARIN (PORCINE) IN NACL 1000-0.9 UT/500ML-% IV SOLN
INTRAVENOUS | Status: AC
  Filled 2019-06-15: qty 1000

## 2019-06-15 MED ORDER — SODIUM CHLORIDE 0.9 % IV SOLN
750.0000 mg | INTRAVENOUS | Status: DC
  Filled 2019-06-15: qty 750

## 2019-06-15 MED ORDER — INSULIN REGULAR(HUMAN) IN NACL 100-0.9 UT/100ML-% IV SOLN
INTRAVENOUS | Status: AC
  Administered 2019-06-16: 1.5 [IU]/h via INTRAVENOUS
  Filled 2019-06-15: qty 100

## 2019-06-15 MED ORDER — CHLORHEXIDINE GLUCONATE CLOTH 2 % EX PADS
6.0000 | MEDICATED_PAD | Freq: Once | CUTANEOUS | Status: AC
  Administered 2019-06-16: 6 via TOPICAL

## 2019-06-15 MED ORDER — FENTANYL CITRATE (PF) 100 MCG/2ML IJ SOLN
INTRAMUSCULAR | Status: AC
  Filled 2019-06-15: qty 2

## 2019-06-15 MED ORDER — IOHEXOL 350 MG/ML SOLN
INTRAVENOUS | Status: DC | PRN
  Administered 2019-06-15: 15:00:00 55 mL via INTRA_ARTERIAL

## 2019-06-15 MED ORDER — POTASSIUM CHLORIDE 2 MEQ/ML IV SOLN
80.0000 meq | INTRAVENOUS | Status: DC
  Filled 2019-06-15: qty 40

## 2019-06-15 MED ORDER — SODIUM CHLORIDE 0.9 % IV SOLN
INTRAVENOUS | Status: DC
  Filled 2019-06-15: qty 30

## 2019-06-15 MED ORDER — SODIUM CHLORIDE 0.9 % IV SOLN
1.5000 g | INTRAVENOUS | Status: AC
  Administered 2019-06-16: 1.5 g via INTRAVENOUS
  Administered 2019-06-16: .75 g via INTRAVENOUS
  Filled 2019-06-15: qty 1.5

## 2019-06-15 MED ORDER — BISACODYL 5 MG PO TBEC
5.0000 mg | DELAYED_RELEASE_TABLET | Freq: Once | ORAL | Status: AC
  Administered 2019-06-15: 5 mg via ORAL
  Filled 2019-06-15: qty 1

## 2019-06-15 MED ORDER — VERAPAMIL HCL 2.5 MG/ML IV SOLN
INTRAVENOUS | Status: AC
  Filled 2019-06-15: qty 2

## 2019-06-15 MED ORDER — MILRINONE LACTATE IN DEXTROSE 20-5 MG/100ML-% IV SOLN
0.3000 ug/kg/min | INTRAVENOUS | Status: DC
  Filled 2019-06-15: qty 100

## 2019-06-15 MED ORDER — LIDOCAINE HCL (PF) 1 % IJ SOLN
INTRAMUSCULAR | Status: DC | PRN
  Administered 2019-06-15: 2 mL via INTRADERMAL

## 2019-06-15 MED ORDER — FENTANYL CITRATE (PF) 100 MCG/2ML IJ SOLN
INTRAMUSCULAR | Status: DC | PRN
  Administered 2019-06-15: 25 ug via INTRAVENOUS

## 2019-06-15 MED ORDER — EPINEPHRINE HCL 5 MG/250ML IV SOLN IN NS
0.0000 ug/min | INTRAVENOUS | Status: DC
  Filled 2019-06-15: qty 250

## 2019-06-15 MED ORDER — SODIUM CHLORIDE 0.9% FLUSH: 3.0000 mL | Freq: Two times a day (BID) | INTRAVENOUS | Status: DC

## 2019-06-15 MED ORDER — TRANEXAMIC ACID 1000 MG/10ML IV SOLN
1.5000 mg/kg/h | INTRAVENOUS | Status: AC
  Administered 2019-06-16: 15 mg/kg/h via INTRAVENOUS
  Filled 2019-06-15: qty 25

## 2019-06-15 MED ORDER — HEPARIN (PORCINE) IN NACL 1000-0.9 UT/500ML-% IV SOLN
INTRAVENOUS | Status: DC | PRN
  Administered 2019-06-15 (×2): 500 mL

## 2019-06-15 MED ORDER — SODIUM CHLORIDE 0.9% FLUSH: 3.0000 mL | INTRAVENOUS | Status: DC | PRN

## 2019-06-15 MED ORDER — HEPARIN SODIUM (PORCINE) 1000 UNIT/ML IJ SOLN
INTRAMUSCULAR | Status: AC
  Filled 2019-06-15: qty 1

## 2019-06-15 MED ORDER — MIDAZOLAM HCL 2 MG/2ML IJ SOLN
INTRAMUSCULAR | Status: DC | PRN
  Administered 2019-06-15: 1 mg via INTRAVENOUS

## 2019-06-15 MED ORDER — TRANEXAMIC ACID (OHS) PUMP PRIME SOLUTION
2.0000 mg/kg | INTRAVENOUS | Status: DC
  Filled 2019-06-15: qty 2.2

## 2019-06-15 SURGICAL SUPPLY — 9 items

## 2019-06-15 NOTE — Interval H&P Note (Signed)
History and Physical Interval Note:  06/10/2019 2:09 PM  Nicole Moreno  has presented today for cardiac catheterization, with the diagnosis of NSTEMI.  The various methods of treatment have been discussed with the patient and family. After consideration of risks, benefits and other options for treatment, the patient has consented to  Procedure(s): LEFT HEART CATH AND CORONARY ANGIOGRAPHY (N/A) as a surgical intervention.  The patient's history has been reviewed, patient examined, no change in status, stable for surgery.  I have reviewed the patient's chart and labs.  Questions were answered to the patient's satisfaction.    Cath Lab Visit (complete for each Cath Lab visit)  Clinical Evaluation Leading to the Procedure:   ACS: Yes.    Non-ACS:  N/A  Brunilda Eble

## 2019-06-15 NOTE — Progress Notes (Signed)
VASCULAR LAB PRELIMINARY  PRELIMINARY  PRELIMINARY  PRELIMINARY  Pre CABG Doppler completed.    Preliminary report:  See CV proc for preliminary results.   Maalle Starrett, RVT 06/21/2019, 5:35 PM

## 2019-06-15 NOTE — TOC Initial Note (Signed)
Transition of Care [facility]) - Initial/Assessment Note    Patient Details  Name: Nicole Moreno MRN: [id number] Date of Birth: May 05, 1951  Transition of Care [facility]) CM/SW Contact:    Bethena Roys, RN Phone Number: 05/24/2019, 4:24 PM  Clinical Narrative:  Pt presented for acute respiratory failure-Nstemi- post cath. Plan for CABG 06/02/2019. PTA independent from home with spouse and adult son. Pt in need of PCP-CM did discuss with patient to contact her insurance to see what office is in network in [place] will continue to follow for transition of care needs.                 Expected Discharge Plan: Home/Self Care Barriers to Discharge: Continued Medical Work up(Plan for CABG- 06/22/2019)   Patient Goals and CMS Choice Patient states their goals for this hospitalization and ongoing recovery are:: "to return home"   Choice offered to / list presented to : NA  Expected Discharge Plan and Services Expected Discharge Plan: Home/Self Care In-house Referral: NA Discharge Planning Services: CM Consult   Living arrangements for the past 2 months: Single Family Home                   Prior Living Arrangements/Services Living arrangements for the past 2 months: Single Family Home Lives with:: Adult Children, Spouse Patient language and need for interpreter reviewed:: Yes        Need for Family Participation in Patient Care: Yes (Comment) Care giver support system in place?: Yes (comment)   Criminal Activity/Legal Involvement Pertinent to Current Situation/Hospitalization: No - Comment as needed  Activities of Daily Living Home Assistive Devices/Equipment: None ADL Screening (condition at time of admission) Patient's cognitive ability adequate to safely complete daily activities?: Yes Is the patient deaf or have difficulty hearing?: No Does the patient have difficulty seeing, even when wearing glasses/contacts?: No Does the patient have difficulty concentrating,  remembering, or making decisions?: No Patient able to express need for assistance with ADLs?: Yes Does the patient have difficulty dressing or bathing?: No Independently performs ADLs?: Yes (appropriate for developmental age) Does the patient have difficulty walking or climbing stairs?: No Weakness of Legs: None Weakness of Arms/Hands: None  Permission Sought/Granted Permission sought to share information with : Family Supports     Emotional Assessment Appearance:: Appears stated age Attitude/Demeanor/Rapport: Engaged Affect (typically observed): Appropriate Orientation: : Oriented to Situation, Oriented to  Time, Oriented to Place, Oriented to Self Alcohol / Substance Use: Not Applicable Psych Involvement: No (comment)  Admission diagnosis:  Elevated troponin [R77.8] Elevated brain natriuretic peptide (BNP) level [R79.89] Acute respiratory failure with hypoxia (HCC) [J96.01] Acute heart failure, unspecified heart failure type (HCC) [I50.9] NSTEMI (non-ST elevated myocardial infarction) [facility]) [I21.4] Patient Active Problem List   Diagnosis Date Noted  . Acute systolic CHF (congestive heart failure) ([place])   . Non-ST elevation (NSTEMI) myocardial infarction ([place])   . Elevated troponin   . Pure hypercholesterolemia   . Hypokalemia   . DCM (dilated cardiomyopathy) ([place])   . NSTEMI (non-ST elevated myocardial infarction) ([place]) 06/14/2019  . ACS (acute coronary syndrome) ([place]) 06/14/2019  . Acute respiratory failure ([place]) 06/09/2019   PCP:  Patient, No Pcp Per Pharmacy:   [id number] - [place], [address] [place] [address] Phone: [phone] Fax: [facility] [address], [place] - [place] [address] [place] [address] Phone: [phone] Fax: [phone]  Social Determinants of Health (SDOH) Interventions    Readmission Risk Interventions No flowsheet data  found.

## 2019-06-15 NOTE — Progress Notes (Signed)
TCTS consulted for CABG evaluation. °

## 2019-06-15 NOTE — Progress Notes (Addendum)
ANTICOAGULATION CONSULT NOTE   Pharmacy Consult for Heparin Indication: chest pain/ACS/NSTEMI  No Known Allergies  Patient Measurements: Height: 5' 6.5" (168.9 cm) Weight: 242 lb 8 oz (110 kg) IBW/kg (Calculated) : 60.45 Heparin Dosing Weight: 90 kg  Vital Signs: Temp: 98.1 F (36.7 C) (11/23 0401) Temp Source: Oral (11/23 0401) BP: 112/74 (11/23 0815) Pulse Rate: 72 (11/23 0815)  Labs: Recent Labs    June 18, 2019 1507 06/01/2019 1813  06/14/19 0344 06/14/19 0824 06/14/19 1043 06/14/19 1344 06/14/19 1554 06/17/2019 0436  HGB 14.3  --   --  14.9  --   --   --   --  13.7  HCT 43.7  --   --  46.8*  --   --   --   --  41.1  PLT 263  --   --  277  --   --   --   --  237  LABPROT  --  14.1  --   --   --   --   --   --   --   INR  --  1.1  --   --   --   --   --   --   --   HEPARINUNFRC  --   --    < >  --  0.25*  --   --  0.27* 0.32  CREATININE 0.70  --   --  0.69  --   --   --   --  0.79  TROPONINIHS 388* 349*   < > 5,150* 5,781* 4,890* 5,331*  --   --    < > = values in this interval not displayed.    Estimated Creatinine Clearance: 85.3 mL/min (by C-G formula based on SCr of 0.79 mg/dL).    Assessment: 77 yoF admitted with NSTEMI. Pharmacy managing IV heparin, cath today. Heparin remains therapeutic, CBC is stable.   Goal of Therapy:  Heparin level 0.3-0.7 units/ml Monitor platelets by anticoagulation protocol: Yes   Plan:  -Continue heparin 1750 units/hr -Daily heparin level and CBC   ADDENDUM: Pharmacy asked to resume IV heparin 2hr after TR band removed. Radial sheath out ~1500. Will time heparin to start 8hr after sheath removed and follow-up to ensure TR band has been off 2hr.   Plan: Resume heparin 1750 units/h no bolus at 2300 tonight if TR band is off >2h Check 8hr heparin level after resuming   Arrie Senate, PharmD, BCPS Clinical Pharmacist [phone] Please check AMION for all [place] numbers 06/06/2019

## 2019-06-15 NOTE — H&P (View-Only) (Signed)
Progress Note  Patient Name: Nicole Moreno Date of Encounter: 06/19/2019  Primary Cardiologist: Mertie Moores, MD   Subjective   Breathing is significantly improved today. No complaints of chest pain, SOB, or LE edema.   Inpatient Medications    Scheduled Meds: . aspirin  324 mg Oral NOW   Or  . aspirin  300 mg Rectal NOW  . aspirin EC  81 mg Oral Daily  . atorvastatin  80 mg Oral q1800  . carvedilol  3.125 mg Oral BID WC  . furosemide  40 mg Intravenous Q12H  . losartan  12.5 mg Oral Daily  . potassium chloride  40 mEq Oral Daily  . potassium chloride  40 mEq Oral Once  . sodium chloride flush  3 mL Intravenous Q12H   Continuous Infusions: . sodium chloride    . sodium chloride 10 mL/hr at 05/25/2019 0602  . heparin 1,750 Units/hr (05/28/2019 0131)   PRN Meds: sodium chloride, acetaminophen, albuterol, aspirin, iohexol, nitroGLYCERIN, ondansetron (ZOFRAN) IV, polyethylene glycol, sodium chloride flush   Vital Signs    Vitals:   06/14/19 1453 06/14/19 1955 06/14/2019 0401 05/26/2019 0815  BP:  115/73 137/75 112/74  Pulse:  79 80 72  Resp:  20 16   Temp:  98.3 F (36.8 C) 98.1 F (36.7 C)   TempSrc:  Oral Oral   SpO2: 97% 94% 93%   Weight:   110 kg   Height:        Intake/Output Summary (Last 24 hours) at 06/21/2019 0838 Last data filed at 06/07/2019 0800 Gross per 24 hour  Intake 270.57 ml  Output 1500 ml  Net -1229.43 ml   Filed Weights   07/01/2019 1438 06/14/19 1337 06/22/2019 0401  Weight: 128.4 kg 111.2 kg 110 kg    Telemetry    Sinus rhythm with occasional PVCs and brief NSVT (4 beats) - Personally Reviewed  ECG    No new tracings - Personally Reviewed  Physical Exam   GEN: Sitting upright on the edge of the bed in no acute distress.   Neck: No JVD, no carotid bruits Cardiac: RRR, no murmurs, rubs, or gallops.  Respiratory: Crackles at lung bases, otherwise no wheezing/rhonchi GI: NABS, Soft, obese, nontender, non-distended  MS: No edema;  No deformity. Neuro:  Nonfocal, moving all extremities spontaneously Psych: Normal affect   Labs    Chemistry Recent Labs  Lab 2019/07/01 1507 06/14/19 0344 06/19/2019 0436  NA 140 140 142  K 3.5 3.5 3.3*  CL 108 105 104  CO2 21* 22 27  GLUCOSE 128* 148* 139*  BUN 17 15 17   CREATININE 0.70 0.69 0.79  CALCIUM 9.1 9.5 9.4  GFRNONAA >60 >60 >60  GFRAA >60 >60 >60  ANIONGAP 11 13 11      Hematology Recent Labs  Lab 2019-07-01 1507 06/14/19 0344 05/27/2019 0436  WBC 12.8* 15.0* 10.2  RBC 4.73 5.03 4.53  HGB 14.3 14.9 13.7  HCT 43.7 46.8* 41.1  MCV 92.4 93.0 90.7  MCH 30.2 29.6 30.2  MCHC 32.7 31.8 33.3  RDW 13.2 13.1 13.1  PLT 263 277 237    Cardiac EnzymesNo results for input(s): TROPONINI in the last 168 hours. No results for input(s): TROPIPOC in the last 168 hours.   BNP Recent Labs  Lab 01-Jul-2019 1507  BNP 465.0*     DDimer No results for input(s): DDIMER in the last 168 hours.   Radiology    Dg Chest 2 View  Result Date: 07-01-19 CLINICAL DATA:  Worsening dyspnea EXAM: CHEST - 2 VIEW COMPARISON:  None. FINDINGS: Borderline enlargement of the cardiopericardial silhouette. Otherwise normal mediastinal contour. No pneumothorax. Trace right pleural effusion. No left pleural effusion. Diffuse linear and hazy parahilar lung opacities. IMPRESSION: 1. Diffuse linear and hazy parahilar lung opacities, favor pulmonary edema given borderline mild enlargement of the cardiopericardial silhouette, with atypical infection on the differential. 2. Trace right pleural effusion. Electronically Signed   By: Jason A Poff M.D.   On: 06/17/2019 15:47   Dg Chest Portable 1 View  Result Date: 05/28/2019 CLINICAL DATA:  Worsening shortness of breath. EXAM: PORTABLE CHEST 1 VIEW COMPARISON:  06/10/2019 at 3:21 a.m. FINDINGS: Lung opacities have increased compared to the earlier exam. There are interstitial and hazy airspace opacities bilaterally, centrally predominant. Lung base opacity  partially obscures the hemidiaphragms suggesting small effusions. IMPRESSION: 1. Worsened lung aeration compared to the earlier exam. Increased interstitial and hazy airspace lung opacities. Pulmonary edema favored as the etiology. Electronically Signed   By: David  Ormond M.D.   On: 06/21/2019 18:38    Cardiac Studies   Echocardiogram 06/14/2019: 1. Left ventricular ejection fraction, by visual estimation, is 30 to 35%. The left ventricle has mild to moderately decreased function. There is moderately increased left ventricular hypertrophy.  2. Elevated left atrial pressure.  3. Apex, nteroseptal and anterior walls are hypokinetic.  4. Global right ventricle has normal systolic function.The right ventricular size is normal. No increase in right ventricular wall thickness.  5. Left atrial size was moderately dilated.  6. Right atrial size was normal.  7. Small pericardial effusion.  8. The pericardial effusion is circumferential.  9. Moderate aortic valve annular calcification. 10. The mitral valve is normal in structure. Mild mitral valve regurgitation. No evidence of mitral stenosis. 11. The tricuspid valve is normal in structure. Tricuspid valve regurgitation is not demonstrated. 12. The aortic valve is tricuspid. Aortic valve regurgitation is not visualized. No evidence of aortic valve sclerosis or stenosis. 13. There is Mild calcification of the aortic valve. 14. There is Moderate thickening of the aortic valve. 15. The pulmonic valve was not well visualized. Pulmonic valve regurgitation is not visualized. 16. The interatrial septum was not well visualized.  Patient Profile     68-year-old female with no known medical history who presented to [facility] ED with a 2 to 3-week history of shortness of breath found to have an elevated HsT now with transfer to [facility] for further cardiac evaluation.  Assessment & Plan    1. Acute combined CHF: Patient presented with progressive SOB x2-3  weeks. Hypoxic and tachypneic on arrival. BNP 465. CXR with pulmonary edema. Echo revealed EF 30-35% with anteroseptal wall motion abnormality c/f ischemic cardiomyopathy. She was started on IV lasix with UOP -1.4L. Wt 245lbs >242lbs. She was started on a BBlocker and ARB. - Plan for LHC today to evaluate for ischemic etiology. The patient understands that risks included but are not limited to stroke (1 in 1000), death (1 in 1000), kidney failure [usually temporary] (1 in 500), bleeding (1 in 200), allergic reaction [possibly serious] (1 in 200).  The patient understands and agrees to proceed.  - Continue IV lasix - Continue carvedilol and losartan - Anticipate transition from losartan to entresto prior to discharge - Consider adding spironolactone if room in BP - Continue to monitor strict I&Os and daily weight - Monitor electrolytes and replete to maintain K >4, Mg >2 - Anticipate surveillance echo in 3 months to monitor for improvement in   EF  2. NSTEMI: one episode of mild chest discomfort a couple weeks ago with subsequent progressive SOB. Initial HsTroponin was 388 and peaked at 5781. Echo with EF 30-35% with anteroseptal wall motion abnormality. She was diuresed with IV lasix and is now able to lay flat. She was started on aspirin and a heparin gtt.  - Plan for LHC today to further evaluate coronary anatomy - Continue aspirin - Will start high intensity statin given LDL of 171 - Will addon Hgb A1C for risk stratification  3. HLD: Tcholesterol 257, HDL 37, LDL 171, triglycerides 244 - Will start atorvastatin 80mg  daily  4. HTN: BP elevated on arrival to the ED (150/103), though has been well controlled in the management of #1. - Continue BBlocker and ARB  5. Suspect OSA: snoring, large neck girth - Consider outpatient sleep study   For questions or updates, please contact [facility] Please consult [email or web address] for contact info under Cardiology/STEMI.      Signed, , PA-C  06/01/2019, 8:38 AM   [phone]

## 2019-06-15 NOTE — Consult Note (Signed)
[address]       [place] [address]             [phone]      Cardiothoracic Surgery Consultation  Reason for Consult: Severe multi-vessel coronary artery disease Referring Physician: Dr. Waylan Boga Nicole Moreno is an 68 y.o. female.  HPI:   The patient is a 68 year old woman with any significant medical history, remote smoker and family history of heart disease who suddenly developed exertional shortness of breath and fatigue about a week and a half before admission. She did not have any chest pain or pressure. She says she had to sleep sitting up and the shortness of breath progressed over the next week or so. She denies any peripheral edema. She presented to [facility] on Saturday and had elevated HsT of 5781. 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 She was initially tachycardic and tachypneic with sats of 88% on RA. CXR showed diffuse perihilar hazy lung opacities suggesting pulmonary edema. Covid negative. She was diuresed. CTA planned to ruled out PE but she could not lay flat in the scanner and it had to be aborted. She was transferred to [facility] for further workup. Echo showed an EF of 30-35% with no significant valvular disease. Cath today shows severe 3 vessel CAD with an elevated LvEDP of 30. She says she has felt much better since diuresis and was able to lay flat last night.  History reviewed. No pertinent past medical history.  History reviewed. No pertinent surgical history.  Family History  Problem Relation Age of Onset  . CAD Father   . CVA Father   . Cancer Sister   . Cancer Brother     Social History:  reports that she quit smoking about 14 years ago. She has never used smokeless tobacco. She reports that she does not drink alcohol or use drugs.  Allergies: No Known Allergies  Medications:  I have reviewed the patient's current medications. Prior to Admission:  Medications Prior to Admission  Medication Sig Dispense  Refill Last Dose  . aspirin 81 MG chewable tablet Chew 81 mg by mouth daily as needed for mild pain or moderate pain.   06/06/2019 at Unknown time  . ibuprofen (ADVIL) 200 MG tablet Take 200 mg by mouth every 6 (six) hours as needed for fever or moderate pain.   06/06/2019 at Unknown time   Scheduled: . aspirin EC  81 mg Oral Daily  . atorvastatin  80 mg Oral q1800  . carvedilol  3.125 mg Oral BID WC  . [START ON 05/28/2019] chlorhexidine  15 mL Mouth/Throat Once  . Chlorhexidine Gluconate Cloth  6 each Topical Once   And  . [START ON 06/09/2019] Chlorhexidine Gluconate Cloth  6 each Topical Once  . [START ON 05/30/2019] diazepam  5 mg Oral Once  . [START ON 06/11/2019] epinephrine  0-10 mcg/min Intravenous To OR  . furosemide  40 mg Intravenous BID  . [START ON 06/21/2019] heparin-papaverine-plasmalyte irrigation   Irrigation To OR  . [START ON 06/14/2019] insulin   Intravenous To OR  . losartan  12.5 mg Oral Daily  . [START ON 06/17/2019] magnesium sulfate  40 mEq Other To OR  . [START ON 05/28/2019] metoprolol tartrate  12.5 mg Oral Once  . [START ON 06/08/2019] phenylephrine  30-200 mcg/min Intravenous To OR  . [START ON 06/10/2019] potassium chloride  80 mEq Other To OR  . potassium chloride  40 mEq Oral Daily  . sodium chloride flush  3 mL Intravenous Q12H  . [START ON 06/19/2019] tranexamic acid  15 mg/kg Intravenous To OR  . [START ON 06/22/2019] tranexamic acid  2 mg/kg Intracatheter To OR   Continuous: . sodium chloride    . [START ON 06/04/2019] cefUROXime (ZINACEF)  IV    . [START ON 05/27/2019] cefUROXime (ZINACEF)  IV    . [START ON 06/02/2019] dexmedetomidine    . [START ON 06/10/2019] DOPamine    . [START ON 05/24/2019] heparin 30,000 units/NS 1000 mL solution for CELLSAVER    . heparin    . [START ON 06/14/2019] milrinone    . [START ON 05/31/2019] nitroGLYCERIN    . [START ON 05/27/2019] tranexamic acid (CYKLOKAPRON) infusion ([facility])    . [START ON 05/29/2019]  vancomycin     FAO:ZHYQMV chloride, acetaminophen, albuterol, ALPRAZolam, hydrALAZINE, iohexol, labetalol, nitroGLYCERIN, ondansetron (ZOFRAN) IV, polyethylene glycol, sodium chloride flush, temazepam Anti-infectives (From admission, onward)   Start     Dose/Rate Route Frequency Ordered Stop   06/01/2019 0400  vancomycin (VANCOCIN) 1,500 mg in sodium chloride 0.9 % 250 mL IVPB     1,500 mg 125 mL/hr over 120 Minutes Intravenous To Surgery 05/30/2019 1605 06/17/19 0400   05/31/2019 0400  cefUROXime (ZINACEF) 1.5 g in sodium chloride 0.9 % 100 mL IVPB     1.5 g 200 mL/hr over 30 Minutes Intravenous To Surgery 06/03/2019 1605 06/17/19 0400   06/17/2019 0400  cefUROXime (ZINACEF) 750 mg in sodium chloride 0.9 % 100 mL IVPB     750 mg 200 mL/hr over 30 Minutes Intravenous To Surgery 06/17/2019 1605 06/17/19 0400      Results for orders placed or performed during the hospital encounter of 06/19/2019 (from the past 48 hour(s))  SARS CORONAVIRUS 2 (TAT 6-24 HRS) Nasopharyngeal Nasopharyngeal Swab     Status: None   Collection Time: 06/02/2019  4:18 PM   Specimen: Nasopharyngeal Swab  Result Value Ref Range   SARS Coronavirus 2 NEGATIVE NEGATIVE    Comment: (NOTE) SARS-CoV-2 target nucleic acids are NOT DETECTED. The SARS-CoV-2 RNA is generally detectable in upper and lower respiratory specimens during the acute phase of infection. Negative results do not preclude SARS-CoV-2 infection, do not rule out co-infections with other pathogens, and should not be used as the sole basis for treatment or other patient management decisions. Negative results must be combined with clinical observations, patient history, and epidemiological information. The expected result is Negative. Fact Sheet for Patients: HairSlick.no Fact Sheet for Healthcare Providers: quierodirigir.com This test is not yet approved or cleared by the [place] FDA and  has been authorized  for detection and/or diagnosis of SARS-CoV-2 by FDA under an Emergency Use Authorization (EUA). This EUA will remain  in effect (meaning this test can be used) for the duration of the COVID-19 declaration under Section 56 4(b)(1) of the Act, 21 U.S.C. section 360bbb-3(b)(1), unless the authorization is terminated or revoked sooner. Performed at [facility], [address], [place], [place] [address]   Troponin I (High Sensitivity)     Status: Abnormal   Collection Time: 06/17/2019  6:13 PM  Result Value Ref Range   Troponin I (High Sensitivity) 349 (HH) <18 ng/L    Comment: CRITICAL VALUE NOTED.  VALUE IS CONSISTENT WITH PREVIOUSLY REPORTED AND CALLED VALUE. Performed at [facility], [address], [place], [place] [address]   Protime-INR     Status: None   Collection Time: 06/07/2019  6:13 PM  Result Value Ref Range   Prothrombin Time 14.1 11.4 - 15.2 seconds   INR 1.1 0.8 - 1.2    Comment: (NOTE) INR goal varies based on device and disease states. Performed at [facility], [address], [place], [place] [address]   Procalcitonin - Baseline     Status: None   Collection Time: 06/07/2019  6:13 PM  Result Value Ref Range   Procalcitonin <0.10 ng/mL    Comment:        Interpretation: PCT (Procalcitonin) <= 0.5 ng/mL: Systemic infection (sepsis) is not likely. Local bacterial infection is possible. (NOTE)       Sepsis PCT Algorithm           Lower Respiratory Tract                                      Infection PCT Algorithm    ----------------------------     ----------------------------         PCT < 0.25 ng/mL                PCT < 0.10 ng/mL         Strongly encourage             Strongly discourage   discontinuation of antibiotics    initiation of antibiotics    ----------------------------     -----------------------------       PCT 0.25 - 0.50 ng/mL            PCT 0.10 - 0.25 ng/mL               OR       >80% decrease in PCT            Discourage initiation of                                             antibiotics      Encourage discontinuation           of antibiotics    ----------------------------     -----------------------------         PCT >= 0.50 ng/mL              PCT 0.26 - 0.50 ng/mL               AND        <80% decrease in PCT             Encourage initiation of                                             antibiotics       Encourage continuation           of antibiotics    ----------------------------     -----------------------------        PCT >= 0.50 ng/mL                  PCT > 0.50 ng/mL               AND         increase in PCT  Strongly encourage                                      initiation of antibiotics    Strongly encourage escalation           of antibiotics                                     -----------------------------                                           PCT <= 0.25 ng/mL                                                 OR                                        > 80% decrease in PCT                                     Discontinue / Do not initiate                                             antibiotics Performed at [facility], [address] [place], [place] [address]   Blood gas, arterial     Status: Abnormal   Collection Time: 06/10/2019  6:58 PM  Result Value Ref Range   FIO2 100.00    pH, Arterial 7.296 (L) 7.350 - 7.450   pCO2 arterial 44.5 32.0 - 48.0 mmHg   pO2, Arterial 248 (H) 83.0 - 108.0 mmHg   Bicarbonate 20.4 20.0 - 28.0 mmol/L   Acid-base deficit 4.4 (H) 0.0 - 2.0 mmol/L   O2 Saturation 99.0 %   Patient temperature 38.0    Allens test (pass/fail) PASS PASS    Comment: Performed at [facility], [address] [place], [place] [address]  Heparin level (unfractionated)     Status: Abnormal   Collection Time: 05/31/2019 11:36 PM  Result Value Ref Range   Heparin Unfractionated 0.10 (L) 0.30 - 0.70 IU/mL    Comment: (NOTE) If heparin results are below expected values, and  patient dosage has  been confirmed, suggest follow up testing of antithrombin III levels. Performed at [facility], [address], [place], [place] [address]   HIV Antibody (routine testing w rflx)     Status: None   Collection Time: 06/01/2019 11:36 PM  Result Value Ref Range   HIV Screen 4th Generation wRfx NON REACTIVE NON REACTIVE    Comment: Performed at [facility], [address] [place], [place] [address]  Troponin I (High Sensitivity)     Status: Abnormal   Collection Time: 05/30/2019 11:36 PM  Result Value Ref Range   Troponin I (High Sensitivity) 3,101 (HH) <18 ng/L  Comment: CRITICAL RESULT CALLED TO, READ BACK BY AND VERIFIED WITH: SAPPELT,J @ 0055 ON 06/14/19 BY JUW Performed at [facility], [address], [place], [place] [address]   CBC     Status: Abnormal   Collection Time: 06/14/19  3:44 AM  Result Value Ref Range   WBC 15.0 (H) 4.0 - 10.5 K/uL   RBC 5.03 3.87 - 5.11 MIL/uL   Hemoglobin 14.9 12.0 - 15.0 g/dL   HCT 60.4 (H) 54.0 - 98.1 %   MCV 93.0 80.0 - 100.0 fL   MCH 29.6 26.0 - 34.0 pg   MCHC 31.8 30.0 - 36.0 g/dL   RDW 19.1 47.8 - 29.5 %   Platelets 277 150 - 400 K/uL   nRBC 0.0 0.0 - 0.2 %    Comment: Performed at [facility], [address] [place], [place] [address]  Basic metabolic panel     Status: Abnormal   Collection Time: 06/14/19  3:44 AM  Result Value Ref Range   Sodium 140 135 - 145 mmol/L   Potassium 3.5 3.5 - 5.1 mmol/L   Chloride 105 98 - 111 mmol/L   CO2 22 22 - 32 mmol/L   Glucose, Bld 148 (H) 70 - 99 mg/dL   BUN 15 8 - 23 mg/dL   Creatinine, Ser 8.65 0.44 - 1.00 mg/dL   Calcium 9.5 8.9 - 78.4 mg/dL   GFR calc non Af Amer >60 >60 mL/min   GFR calc Af Amer >60 >60 mL/min   Anion gap 13 5 - 15    Comment: Performed at [facility], [address], [place], [place] [address]  Troponin I (High Sensitivity)     Status: Abnormal   Collection Time: 06/14/19  3:44 AM  Result Value Ref Range   Troponin I (High Sensitivity) 5,150  (HH) <18 ng/L    Comment: CRITICAL RESULT CALLED TO, READ BACK BY AND VERIFIED WITH: BETHANY B.,RN @1523   06/14/2019 KAY (NOTE) Elevated high sensitivity troponin I (hsTnI) values and significant  changes across serial measurements may suggest ACS but many other  chronic and acute conditions are known to elevate hsTnI results.  Refer to the Links section for chest pain algorithms and additional  guidance. Performed at [facility], [address] [place], [place] [address] CORRECTED ON 11/22 AT 1525: PREVIOUSLY REPORTED AS 6033 CRITICAL RESULT CALLED TO, READ BACK BY AND VERIFIED WITH: PRUITT,G @ 0440 ON 06/14/19 BY JUW   Heparin level (unfractionated)     Status: Abnormal   Collection Time: 06/14/19  8:24 AM  Result Value Ref Range   Heparin Unfractionated 0.25 (L) 0.30 - 0.70 IU/mL    Comment: (NOTE) If heparin results are below expected values, and patient dosage has  been confirmed, suggest follow up testing of antithrombin III levels. Performed at [facility], [address], [place], [place] [address]   Troponin I (High Sensitivity)     Status: Abnormal   Collection Time: 06/14/19  8:24 AM  Result Value Ref Range   Troponin I (High Sensitivity) 5,781 (HH) <18 ng/L    Comment: CRITICAL RESULT CALLED TO, READ BACK BY AND VERIFIED WITH: MANDY C.,RN  @0914   06/14/2019 KAY (NOTE) Elevated high sensitivity troponin I (hsTnI) values and significant  changes across serial measurements may suggest ACS but many other  chronic and acute conditions are known to elevate hsTnI results.  Refer to the Links section for chest pain algorithms and additional  guidance. Performed at [facility], [address],  [place], [place] [address]   Troponin I (High Sensitivity)     Status: Abnormal   Collection Time: 06/14/19 10:43 AM  Result Value Ref Range   Troponin I (High Sensitivity) 4,890 (HH) <18 ng/L    Comment: CRITICAL RESULT CALLED TO, READ BACK BY AND VERIFIED WITH: CRUISE @ 1137 ON  [id number] BY HENDERSON L. Performed at [facility], [address] [place], [place] [address]   Troponin I (High Sensitivity)     Status: Abnormal   Collection Time: 06/14/19  1:44 PM  Result Value Ref Range   Troponin I (High Sensitivity) 5,331 (HH) <18 ng/L    Comment: CRITICAL RESULT CALLED TO, READ BACK BY AND VERIFIED WITH: S.BARNHILL,RN @ 1500 06/14/2019 WEBBERJ (NOTE) Elevated high sensitivity troponin I (hsTnI) values and significant  changes across serial measurements may suggest ACS but many other  chronic and acute conditions are known to elevate hsTnI results.  Refer to the Links section for chest pain algorithms and additional  guidance. Performed at [facility], [address] [place], [place] [address]   Heparin level (unfractionated)     Status: Abnormal   Collection Time: 06/14/19  3:54 PM  Result Value Ref Range   Heparin Unfractionated 0.27 (L) 0.30 - 0.70 IU/mL    Comment: (NOTE) If heparin results are below expected values, and patient dosage has  been confirmed, suggest follow up testing of antithrombin III levels. Performed at [facility], [address] [place], [place] [address]   Magnesium     Status: None   Collection Time: 06/22/2019  4:36 AM  Result Value Ref Range   Magnesium 2.0 1.7 - 2.4 mg/dL    Comment: Performed at [facility], [address] [place], [place] [address]  Basic metabolic panel     Status: Abnormal   Collection Time: 05/26/2019  4:36 AM  Result Value Ref Range   Sodium 142 135 - 145 mmol/L   Potassium 3.3 (L) 3.5 - 5.1 mmol/L   Chloride 104 98 - 111 mmol/L   CO2 27 22 - 32 mmol/L   Glucose, Bld 139 (H) 70 - 99 mg/dL   BUN 17 8 - 23 mg/dL   Creatinine, Ser 1.320.79 0.44 - 1.00 mg/dL   Calcium 9.4 8.9 - 44.010.3 mg/dL   GFR calc non Af Amer >60 >60 mL/min   GFR calc Af Amer >60 >60 mL/min   Anion gap 11 5 - 15    Comment: Performed at [facility], [address] [place], [place] [address]  Lipid panel      Status: Abnormal   Collection Time: 06/08/2019  4:36 AM  Result Value Ref Range   Cholesterol 257 (H) 0 - 200 mg/dL   Triglycerides 536244 (H) <150 mg/dL   HDL 37 (L) >64>40 mg/dL   Total CHOL/HDL Ratio 6.9 RATIO   VLDL 49 (H) 0 - 40 mg/dL   LDL Cholesterol 403171 (H) 0 - 99 mg/dL    Comment:        Total Cholesterol/HDL:CHD Risk Coronary Heart Disease Risk Table                     Men   Women  1/2 Average Risk   3.4   3.3  Average Risk       5.0   4.4  2 X Average Risk   9.6   7.1  3 X Average Risk  23.4   11.0  Use the calculated Patient Ratio above and the CHD Risk Table to determine the patient's CHD Risk.        ATP III CLASSIFICATION (LDL):  <100     mg/dL   Optimal  161-096  mg/dL   Near or Above                    Optimal  130-159  mg/dL   Borderline  045-409  mg/dL   High  >811     mg/dL   Very High Performed at [facility], [address], [place], [place] [address]   CBC     Status: None   Collection Time: 06/19/2019  4:36 AM  Result Value Ref Range   WBC 10.2 4.0 - 10.5 K/uL   RBC 4.53 3.87 - 5.11 MIL/uL   Hemoglobin 13.7 12.0 - 15.0 g/dL   HCT 29.5 62.1 - 30.8 %   MCV 90.7 80.0 - 100.0 fL   MCH 30.2 26.0 - 34.0 pg   MCHC 33.3 30.0 - 36.0 g/dL   RDW 65.7 84.6 - 96.2 %   Platelets 237 150 - 400 K/uL   nRBC 0.0 0.0 - 0.2 %    Comment: Performed at [facility], [address] [place], [place] [address]  Heparin level (unfractionated)     Status: None   Collection Time: 06/21/2019  4:36 AM  Result Value Ref Range   Heparin Unfractionated 0.32 0.30 - 0.70 IU/mL    Comment: (NOTE) If heparin results are below expected values, and patient dosage has  been confirmed, suggest follow up testing of antithrombin III levels. Performed at [facility], [address], [place], [place] [address]   Hemoglobin A1c     Status: Abnormal   Collection Time: 05/26/2019  8:28 AM  Result Value Ref Range   Hgb A1c MFr Bld 6.6 (H) 4.8 - 5.6 %    Comment:  (NOTE) Pre diabetes:          5.7%-6.4% Diabetes:              >6.4% Glycemic control for   <7.0% adults with diabetes    Mean Plasma Glucose 142.72 mg/dL    Comment: Performed at [facility], [address] [place], [place] [address]    Dg Chest Portable 1 View  Result Date: 06/10/2019 CLINICAL DATA:  Worsening shortness of breath. EXAM: PORTABLE CHEST 1 VIEW COMPARISON:  06/19/2019 at 3:21 a.m. FINDINGS: Lung opacities have increased compared to the earlier exam. There are interstitial and hazy airspace opacities bilaterally, centrally predominant. Lung base opacity partially obscures the hemidiaphragms suggesting small effusions. IMPRESSION: 1. Worsened lung aeration compared to the earlier exam. Increased interstitial and hazy airspace lung opacities. Pulmonary edema favored as the etiology. Electronically Signed   By: Amie Portland M.D.   On: 05/25/2019 18:38    Review of Systems  Constitutional: Positive for malaise/fatigue. Negative for chills, fever and weight loss.  HENT: Negative.   Eyes: Negative.   Respiratory: Positive for shortness of breath. Negative for cough, hemoptysis and sputum production.   Cardiovascular: Positive for orthopnea. Negative for chest pain and leg swelling.  Gastrointestinal: Negative.   Genitourinary: Negative.   Musculoskeletal: Negative.   Skin: Negative.   Neurological: Negative for dizziness.  Endo/Heme/Allergies: Negative.   Psychiatric/Behavioral: Negative.    Blood pressure 119/67, pulse 73, temperature 98 F (36.7 C), temperature source Oral, resp. rate 15, height 5' 6.5" (1.689 m),  weight 110 kg, SpO2 97 %. Physical Exam    ECHOCARDIOGRAM REPORT       Patient Name:   Nicole Moreno Date of Exam: 06/14/2019 Medical Rec #:  [id number]       Height:       66.0 in Accession #:    [id number]      Weight:       283.0 lb Date of Birth:  04/23/1951       BSA:          2.32 m Patient Age:    68 years        BP:           111/66  mmHg Patient Gender: F               HR:           83 bpm. [facility]am [facility]  Procedure: 2D Echo  Indications:    Dyspnea 786.09 / R06.00   History:        Patient has no prior history of Echocardiogram examinations.                 Risk Factors:Former Smoker. Acute Respiratory failure, NSTEMI.   Sonographer:    Jeryl Columbia RDCS (AE) Referring Phys: [id number] [facility]  IMPRESSIONS    1. Left ventricular ejection fraction, by visual estimation, is 30 to 35%. The left ventricle has mild to moderately decreased function. There is moderately increased left ventricular hypertrophy.  2. Elevated left atrial pressure.  3. Apex, nteroseptal and anterior walls are hypokinetic.  4. Global right ventricle has normal systolic function.The right ventricular size is normal. No increase in right ventricular wall thickness.  5. Left atrial size was moderately dilated.  6. Right atrial size was normal.  7. Small pericardial effusion.  8. The pericardial effusion is circumferential.  9. Moderate aortic valve annular calcification. 10. The mitral valve is normal in structure. Mild mitral valve regurgitation. No evidence of mitral stenosis. 11. The tricuspid valve is normal in structure. Tricuspid valve regurgitation is not demonstrated. 12. The aortic valve is tricuspid. Aortic valve regurgitation is not visualized. No evidence of aortic valve sclerosis or stenosis. 13. There is Mild calcification of the aortic valve. 14. There is Moderate thickening of the aortic valve. 15. The pulmonic valve was not well visualized. Pulmonic valve regurgitation is not visualized. 16. The interatrial septum was not well visualized.  FINDINGS  Left Ventricle: Left ventricular ejection fraction, by visual estimation, is 30 to 35%. The left ventricle has mild to moderately decreased function. There is moderately increased left ventricular hypertrophy. Elevated left atrial pressure. Apex,   nteroseptal and anterior walls are hypokinetic.  Right Ventricle: The right ventricular size is normal. No increase in right ventricular wall thickness. Global RV systolic function is has normal systolic function.  Left Atrium: Left atrial size was moderately dilated.  Right Atrium: Right atrial size was normal in size  Pericardium: A small pericardial effusion is present. The pericardial effusion is circumferential.  Mitral Valve: The mitral valve is normal in structure. No evidence of mitral valve stenosis by observation. Mild mitral valve regurgitation.  Tricuspid Valve: The tricuspid valve is normal in structure. Tricuspid valve regurgitation is not demonstrated.  Aortic Valve: The aortic valve is tricuspid. . There is Moderate thickening and Mild calcification of the aortic valve. Aortic valve regurgitation is not visualized. The aortic valve is structurally normal, with no evidence of sclerosis or stenosis.  Moderate aortic valve annular calcification. There is Moderate thickening of the aortic valve. Mild calcification. Aortic valve mean gradient measures 8.6 mmHg. Aortic valve peak gradient measures 15.5 mmHg. Aortic valve area, by VTI measures 1.38 cm.  Pulmonic Valve: The pulmonic valve was not well visualized. Pulmonic valve regurgitation is not visualized. No evidence of pulmonic stenosis.  Aorta: The aortic root is normal in size and structure.  Pulmonary Artery: Indeterminant PASP, inadequate TR jet.  Venous: The inferior vena cava was not well visualized.  IAS/Shunts: The interatrial septum was not well visualized.     LEFT VENTRICLE PLAX 2D LVIDd:         6.10 cm  Diastology LV PW:         1.66 cm  LV e' lateral:   5.66 cm/s LV IVS:        1.50 cm  LV E/e' lateral: 16.7 LVOT diam:     2.10 cm  LV e' medial:    4.35 cm/s LVOT Area:     3.46 cm LV E/e' medial:  21.7    RIGHT VENTRICLE RV S prime:     11.00 cm/s TAPSE (M-mode): 2.0 cm  LEFT  ATRIUM           Index       RIGHT ATRIUM           Index LA diam:      4.50 cm 1.94 cm/m  RA Area:     15.30 cm LA Vol (A2C): 55.0 ml 23.73 ml/m RA Volume:   42.70 ml  18.42 ml/m LA Vol (A4C): 79.5 ml 34.30 ml/m  AORTIC VALVE AV Area (Vmax):    1.61 cm AV Area (Vmean):   1.44 cm AV Area (VTI):     1.38 cm AV Vmax:           196.55 cm/s AV Vmean:          140.453 cm/s AV VTI:            0.361 m AV Peak Grad:      15.5 mmHg AV Mean Grad:      8.6 mmHg LVOT Vmax:         91.19 cm/s LVOT Vmean:        58.560 cm/s LVOT VTI:          0.144 m LVOT/AV VTI ratio: 0.40   AORTA Ao Root diam: 3.20 cm  MITRAL VALVE MV Area (PHT): 3.85 cm             SHUNTS MV PHT:        57.13 msec           Systemic VTI:  0.14 m MV Decel Time: 197 msec             Systemic Diam: 2.10 cm MR Peak grad: 80.3 mmHg MR Mean grad: 53.0 mmHg MR Vmax:      448.00 cm/s MR Vmean:     341.0 cm/s MV E velocity: 94.60 cm/s 103 cm/s MV A velocity: 58.20 cm/s 70.3 cm/s MV E/A ratio:  1.63       1.5    Dina Rich MD Electronically signed by Dina Rich MD Signature Date/Time: 06/14/2019/12:01:47 PM   Physicians  Panel Physicians Referring Physician Case Authorizing Physician  End, Cristal Deer, MD (Primary)    Procedures  LEFT HEART CATH AND CORONARY ANGIOGRAPHY  Conclusion  Conclusions: 1. Severe three-vessel coronary artery disease including tandem 80-90% proximal/mid LAD stenoses, 70% mid LCx lesion, and  chronic total occlusion of the proximal/mid RCA with left-to-right collaterals. 2. Moderately elevated left ventricular filling pressure (LVEDP ~30 mmHg).  Recommendations: 1. Given three-vessel disease, reduced LVEF, and recently diagnosed diabetes mellitus, cardiac surgery consultation for CABG is recommended. 2. Continue diuresis and escalation of evidence-based heart failure therapy, as tolerated. 3. Restart heparin infusion 2 hours after TR band has been removed. 4. Aggressive  secondary prevention of coronary artery disease.  Yvonne Kendall, MD [facility] Pager: [phone]   Recommendations  Antiplatelet/Anticoag Indefinite aspirin 81 mg daily.  No P2Y12 inhibitor pending cardiac surgery consultation.  Discharge Date Anticipated discharge date to be determined.  Indications  Non-ST elevation (NSTEMI) myocardial infarction (HCC) [I21.4 (ICD-10-CM)]  Acute systolic heart failure (HCC) [I50.21 (ICD-10-CM)]  Procedural Details  Technical Details Indication: 68 y.o. year-old woman with no known past medical history (diagnosed with diabetes mellitus during this admission), admitted to the [facility] emergency department with chest pain and shortness of breath.  She was noted to have elevated troponin and LVEF of 30-35% by echo.  She was therefore transferred to [facility] and referred for left heart catheterization with possible PCI.  She notes that her breathing has improved with diuresis.  GFR: >60 ml/min  Procedure: The risks, benefits, complications, treatment options, and expected outcomes were discussed with the patient. The patient and/or family concurred with the proposed plan, giving informed consent. The patient was sedated with IV midazolam and fentanyl. The right wrist was assessed with a modified Allens test which was normal. The right wrist was prepped and draped in a sterile fashion. 1% lidocaine was used for local anesthesia. Using the modified Seldinger access technique, a 48F slender Glidesheath was placed in the right radial artery. 3 mg Verapamil was given through the sheath. Heparin 5,000 units were administered.  Selective coronary angiography was performed using 38F JL3.5 and JR4 catheters to engage the left and right coronary arteries, respectively. Left heart catheterization was performed using a 38F JR4 catheter. Left ventriculogram was not performed.  At the end of the procedure, the radial artery sheath was removed and a TR band  applied to achieve patent hemostasis. There were no immediate complications. The patient was taken to the recovery area in stable condition.  Estimated blood loss <50 mL.   During this procedure medications were administered to achieve and maintain moderate conscious sedation while the patient's heart rate, blood pressure, and oxygen saturation were continuously monitored and I was present face-to-face 100% of this time.  Medications (Filter: Administrations occurring from 06/01/2019 1357 to 05/27/2019 1502) (important)  Continuous medications are totaled by the amount administered until 06/09/2019 1502.  Medication Rate/Dose/Volume Action  Date Time   Heparin (Porcine) in NaCl 1000-0.9 UT/500ML-% SOLN (mL) 500 mL Given 05/28/2019 1400   Total dose as of 06/12/2019 1502 500 mL Given 1400   1,000 mL        fentaNYL (SUBLIMAZE) injection (mcg) 25 mcg Given 06/06/2019 1418   Total dose as of 06/14/2019 1502        25 mcg        midazolam (VERSED) injection (mg) 1 mg Given 06/14/2019 1419   Total dose as of 06/07/2019 1502        1 mg        lidocaine (PF) (XYLOCAINE) 1 % injection (mL) 2 mL Given 06/22/2019 1427   Total dose as of 06/17/2019 1502        2 mL        Radial Cocktail/Verapamil  only (mL) 10 mL Given 06/19/2019 1428   Total dose as of 06/03/2019 1502        10 mL        heparin injection (Units) 5,000 Units Given 06/21/2019 1429   Total dose as of 06/07/2019 1502        5,000 Units        iohexol (OMNIPAQUE) 350 MG/ML injection (mL) 55 mL Given 06/05/2019 1445   Total dose as of 06/17/2019 1502        55 mL        Sedation Time  Sedation Time Physician-1: 25 minutes 28 seconds  Contrast  Medication Name Total Dose  iohexol (OMNIPAQUE) 350 MG/ML injection 55 mL    Radiation/Fluoro  Fluoro time: 4.5 (min) DAP: 33458 (mGycm2) Cumulative Air Kerma: 502 (mGy)  Complications  Complications documented before study signed (05/30/2019 3:16 PM)   No complications were associated with this study.   Documented by Yvonne Kendall, MD - 06/10/2019 3:13 PM    Coronary Findings  Diagnostic Dominance: Right Left Main  Vessel is large. Vessel is angiographically normal.  Left Anterior Descending  Vessel is large.  Prox LAD lesion 90% stenosed  Prox LAD lesion is 90% stenosed. The lesion is focal.  Mid LAD lesion 80% stenosed  Mid LAD lesion is 80% stenosed.  Dist LAD lesion 30% stenosed  Dist LAD lesion is 30% stenosed.  First Diagonal Branch  Vessel is small in size.  Second Diagonal Branch  Vessel is small in size.  Third Diagonal Branch  Vessel is small in size.  Left Circumflex  Vessel is large.  Mid Cx lesion 70% stenosed  Mid Cx lesion is 70% stenosed. The lesion is focal and eccentric.  First Obtuse Marginal Branch  Vessel is small in size.  Third Obtuse Marginal Branch  Vessel is moderate in size.  Fourth Obtuse Marginal Branch  Vessel is small in size.  Right Coronary Artery  Vessel is moderate in size.  Prox RCA to Mid RCA lesion 100% stenosed  Prox RCA to Mid RCA lesion is 100% stenosed. The lesion is chronically occluded.  Third Right Posterolateral Branch  Collaterals  3rd RPL filled by collaterals from 4th Mrg.    Collaterals  3rd RPL filled by collaterals from Dist Cx.    Intervention  No interventions have been documented. Left Heart  Left Ventricle LV end diastolic pressure is moderately elevated. LVEDP ~30 mmHg.  Aortic Valve There is no aortic valve stenosis.  Coronary Diagrams  Diagnostic Dominance: Right  Intervention  Implants   No implant documentation for this case.  Syngo Images  Show images for CARDIAC CATHETERIZATION  Images on Long Term Storage  Show images for Carnisha, Nicole Moreno to Procedure Log  Procedure Log    Hemo Data   Most Recent Value  AO Systolic Pressure 109 mmHg  AO Diastolic Pressure 60 mmHg  AO Mean 81 mmHg  LV Systolic Pressure 101 mmHg  LV Diastolic Pressure 13 mmHg  LV EDP 30 mmHg  AOp  Systolic Pressure 97 mmHg  AOp Diastolic Pressure 58 mmHg  AOp Mean Pressure 75 mmHg  LVp Systolic Pressure 103 mmHg  LVp Diastolic Pressure 13 mmHg  LVp EDP Pressure 30 mmHg      Assessment/Plan:  This 68 year old newly diagnosed diabetic woman has severe 3 vessel coronary artery disease presenting with a NSTEMI and acute on chronic systolic heart failure with an EF of 30-35%, elevated BNP of 465,  and pulmonary edema on CXR. I have personally reviewed her echo and cath studies. She has severe 3-vessel coronary artery disease with tandem high grade LAD stenoses, 70% LCX stenosis and an occluded RCA with left to right collaterals. I agree that CABG is the best treatment for her. I discussed the operative procedure with the patient including alternatives, benefits and risks; including but not limited to bleeding, blood transfusion, infection, stroke, myocardial infarction, graft failure, heart block requiring a permanent pacemaker, organ dysfunction, and death.  Nicole Moreno understands and agrees to proceed.  We will schedule surgery for tomorrow morning.  I spent 40 minutes performing this consultation and > 50% of this time was spent face to face counseling and coordinating the care of this patient's severe multi-vessel coronary artery disease.  Nicole Moreno 06/02/2019, 3:52 PM

## 2019-06-15 NOTE — Progress Notes (Signed)
Progress Note  Patient Name: Nicole Moreno Date of Encounter: 06/14/2019  Primary Cardiologist: Mertie Moores, MD   Subjective   Breathing is significantly improved today. No complaints of chest pain, SOB, or LE edema.   Inpatient Medications    Scheduled Meds: . aspirin  324 mg Oral NOW   Or  . aspirin  300 mg Rectal NOW  . aspirin EC  81 mg Oral Daily  . atorvastatin  80 mg Oral q1800  . carvedilol  3.125 mg Oral BID WC  . furosemide  40 mg Intravenous Q12H  . losartan  12.5 mg Oral Daily  . potassium chloride  40 mEq Oral Daily  . potassium chloride  40 mEq Oral Once  . sodium chloride flush  3 mL Intravenous Q12H   Continuous Infusions: . sodium chloride    . sodium chloride 10 mL/hr at 05/25/2019 0602  . heparin 1,750 Units/hr (05/28/2019 0131)   PRN Meds: sodium chloride, acetaminophen, albuterol, aspirin, iohexol, nitroGLYCERIN, ondansetron (ZOFRAN) IV, polyethylene glycol, sodium chloride flush   Vital Signs    Vitals:   06/14/19 1453 06/14/19 1955 06/14/2019 0401 05/26/2019 0815  BP:  115/73 137/75 112/74  Pulse:  79 80 72  Resp:  20 16   Temp:  98.3 F (36.8 C) 98.1 F (36.7 C)   TempSrc:  Oral Oral   SpO2: 97% 94% 93%   Weight:   110 kg   Height:        Intake/Output Summary (Last 24 hours) at 06/21/2019 0838 Last data filed at 06/07/2019 0800 Gross per 24 hour  Intake 270.57 ml  Output 1500 ml  Net -1229.43 ml   Filed Weights   07/01/2019 1438 06/14/19 1337 06/22/2019 0401  Weight: 128.4 kg 111.2 kg 110 kg    Telemetry    Sinus rhythm with occasional PVCs and brief NSVT (4 beats) - Personally Reviewed  ECG    No new tracings - Personally Reviewed  Physical Exam   GEN: Sitting upright on the edge of the bed in no acute distress.   Neck: No JVD, no carotid bruits Cardiac: RRR, no murmurs, rubs, or gallops.  Respiratory: Crackles at lung bases, otherwise no wheezing/rhonchi GI: NABS, Soft, obese, nontender, non-distended  MS: No edema;  No deformity. Neuro:  Nonfocal, moving all extremities spontaneously Psych: Normal affect   Labs    Chemistry Recent Labs  Lab 2019/07/01 1507 06/14/19 0344 06/19/2019 0436  NA 140 140 142  K 3.5 3.5 3.3*  CL 108 105 104  CO2 21* 22 27  GLUCOSE 128* 148* 139*  BUN 17 15 17   CREATININE 0.70 0.69 0.79  CALCIUM 9.1 9.5 9.4  GFRNONAA >60 >60 >60  GFRAA >60 >60 >60  ANIONGAP 11 13 11      Hematology Recent Labs  Lab 2019-07-01 1507 06/14/19 0344 05/27/2019 0436  WBC 12.8* 15.0* 10.2  RBC 4.73 5.03 4.53  HGB 14.3 14.9 13.7  HCT 43.7 46.8* 41.1  MCV 92.4 93.0 90.7  MCH 30.2 29.6 30.2  MCHC 32.7 31.8 33.3  RDW 13.2 13.1 13.1  PLT 263 277 237    Cardiac EnzymesNo results for input(s): TROPONINI in the last 168 hours. No results for input(s): TROPIPOC in the last 168 hours.   BNP Recent Labs  Lab 01-Jul-2019 1507  BNP 465.0*     DDimer No results for input(s): DDIMER in the last 168 hours.   Radiology    Dg Chest 2 View  Result Date: 07-01-19 CLINICAL DATA:  Worsening dyspnea EXAM: CHEST - 2 VIEW COMPARISON:  None. FINDINGS: Borderline enlargement of the cardiopericardial silhouette. Otherwise normal mediastinal contour. No pneumothorax. Trace right pleural effusion. No left pleural effusion. Diffuse linear and hazy parahilar lung opacities. IMPRESSION: 1. Diffuse linear and hazy parahilar lung opacities, favor pulmonary edema given borderline mild enlargement of the cardiopericardial silhouette, with atypical infection on the differential. 2. Trace right pleural effusion. Electronically Signed   By: Delbert PhenixJason A Poff M.D.   On: 06/21/2019 15:47   Dg Chest Portable 1 View  Result Date: 05/29/2019 CLINICAL DATA:  Worsening shortness of breath. EXAM: PORTABLE CHEST 1 VIEW COMPARISON:  05/24/2019 at 3:21 a.m. FINDINGS: Lung opacities have increased compared to the earlier exam. There are interstitial and hazy airspace opacities bilaterally, centrally predominant. Lung base opacity  partially obscures the hemidiaphragms suggesting small effusions. IMPRESSION: 1. Worsened lung aeration compared to the earlier exam. Increased interstitial and hazy airspace lung opacities. Pulmonary edema favored as the etiology. Electronically Signed   By: Amie Portlandavid  Ormond M.D.   On: 06/02/2019 18:38    Cardiac Studies   Echocardiogram 06/14/2019: 1. Left ventricular ejection fraction, by visual estimation, is 30 to 35%. The left ventricle has mild to moderately decreased function. There is moderately increased left ventricular hypertrophy.  2. Elevated left atrial pressure.  3. Apex, nteroseptal and anterior walls are hypokinetic.  4. Global right ventricle has normal systolic function.The right ventricular size is normal. No increase in right ventricular wall thickness.  5. Left atrial size was moderately dilated.  6. Right atrial size was normal.  7. Small pericardial effusion.  8. The pericardial effusion is circumferential.  9. Moderate aortic valve annular calcification. 10. The mitral valve is normal in structure. Mild mitral valve regurgitation. No evidence of mitral stenosis. 11. The tricuspid valve is normal in structure. Tricuspid valve regurgitation is not demonstrated. 12. The aortic valve is tricuspid. Aortic valve regurgitation is not visualized. No evidence of aortic valve sclerosis or stenosis. 13. There is Mild calcification of the aortic valve. 14. There is Moderate thickening of the aortic valve. 15. The pulmonic valve was not well visualized. Pulmonic valve regurgitation is not visualized. 16. The interatrial septum was not well visualized.  Patient Profile     68 year old female with no known medical history who presented to [facility] ED with a 2 to 3-week history of shortness of breath found to have an elevated HsT now [facility].  Assessment & Plan    1. Acute combined CHF: Patient presented with progressive SOB x2-3  weeks. Hypoxic and tachypneic on arrival. BNP 465. CXR with pulmonary edema. Echo revealed EF 30-35% with anteroseptal wall motion abnormality c/f ischemic cardiomyopathy. She was started on IV lasix with UOP -1.4L. Wt 245lbs >242lbs. She was started on a BBlocker and ARB. - Plan for LHC today to evaluate for ischemic etiology. The patient understands that risks included but are not limited to stroke (1 in 1000), death (1 in 1000), kidney failure [usually temporary] (1 in 500), bleeding (1 in 200), allergic reaction [possibly serious] (1 in 200).  The patient understands and agrees to proceed.  - Continue IV lasix - Continue carvedilol and losartan - Anticipate transition from losartan to entresto prior to discharge - Consider adding spironolactone if room in BP - Continue to monitor strict I&Os and daily weight - Monitor electrolytes and replete to maintain K >4, Mg >2 - Anticipate surveillance echo in 3 months to monitor for improvement in  EF  2. NSTEMI: one episode of mild chest discomfort a couple weeks ago with subsequent progressive SOB. Initial HsTroponin was 388 and peaked at 5781. Echo with EF 30-35% with anteroseptal wall motion abnormality. She was diuresed with IV lasix and is now able to lay flat. She was started on aspirin and a heparin gtt.  - Plan for LHC today to further evaluate coronary anatomy - Continue aspirin - Will start high intensity statin given LDL of 171 - Will addon Hgb A1C for risk stratification  3. HLD: Tcholesterol 257, HDL 37, LDL 171, triglycerides 244 - Will start atorvastatin 80mg  daily  4. HTN: BP elevated on arrival to the ED (150/103), though has been well controlled in the management of #1. - Continue BBlocker and ARB  5. Suspect OSA: snoring, large neck girth - Consider outpatient sleep study   For questions or updates, please contact [facility] Please consult [email or web address] for contact info under Cardiology/STEMI.      Signed, , PA-C  06/05/2019, 8:38 AM   [phone]

## 2019-06-16 ENCOUNTER — Inpatient Hospital Stay (HOSPITAL_COMMUNITY): Payer: Medicare Other | Admitting: Anesthesiology

## 2019-06-16 ENCOUNTER — Encounter (HOSPITAL_COMMUNITY): Admission: EM | Disposition: E | Payer: Self-pay | Source: Home / Self Care | Attending: Surgery

## 2019-06-16 ENCOUNTER — Inpatient Hospital Stay (HOSPITAL_COMMUNITY): Payer: Medicare Other

## 2019-06-16 ENCOUNTER — Encounter (HOSPITAL_COMMUNITY): Payer: Self-pay | Admitting: Internal Medicine

## 2019-06-16 DIAGNOSIS — Z951 Presence of aortocoronary bypass graft: Secondary | ICD-10-CM

## 2019-06-16 DIAGNOSIS — I25110 Atherosclerotic heart disease of native coronary artery with unstable angina pectoris: Secondary | ICD-10-CM

## 2019-06-16 HISTORY — PX: CORONARY ARTERY BYPASS GRAFT: SHX141

## 2019-06-16 HISTORY — PX: TEE WITHOUT CARDIOVERSION: SHX5443

## 2019-06-16 LAB — POCT I-STAT 7, (LYTES, BLD GAS, ICA,H+H)
Acid-Base Excess: 1 mmol/L (ref 0.0–2.0)
Acid-Base Excess: 4 mmol/L — ABNORMAL HIGH (ref 0.0–2.0)
Acid-base deficit: 1 mmol/L (ref 0.0–2.0)
Acid-base deficit: 1 mmol/L (ref 0.0–2.0)
Acid-base deficit: 1 mmol/L (ref 0.0–2.0)
Bicarbonate: 25.8 mmol/L (ref 20.0–28.0)
Bicarbonate: 28 mmol/L (ref 20.0–28.0)
Bicarbonate: 23.7 mmol/L (ref 20.0–28.0)
Bicarbonate: 25.5 mmol/L (ref 20.0–28.0)
Bicarbonate: 24.5 mmol/L (ref 20.0–28.0)
Calcium, Ion: 1.3 mmol/L (ref 1.15–1.40)
Calcium, Ion: 1.01 mmol/L — ABNORMAL LOW (ref 1.15–1.40)
Calcium, Ion: 1.19 mmol/L (ref 1.15–1.40)
Calcium, Ion: 1.21 mmol/L (ref 1.15–1.40)
Calcium, Ion: 1.22 mmol/L (ref 1.15–1.40)
HCT: 39 % (ref 36.0–46.0)
HCT: 31 % — ABNORMAL LOW (ref 36.0–46.0)
HCT: 33 % — ABNORMAL LOW (ref 36.0–46.0)
HCT: 34 % — ABNORMAL LOW (ref 36.0–46.0)
HCT: 32 % — ABNORMAL LOW (ref 36.0–46.0)
Hemoglobin: 13.3 g/dL (ref 12.0–15.0)
Hemoglobin: 10.5 g/dL — ABNORMAL LOW (ref 12.0–15.0)
Hemoglobin: 11.2 g/dL — ABNORMAL LOW (ref 12.0–15.0)
Hemoglobin: 11.6 g/dL — ABNORMAL LOW (ref 12.0–15.0)
Hemoglobin: 10.9 g/dL — ABNORMAL LOW (ref 12.0–15.0)
O2 Saturation: 98 %
O2 Saturation: 100 %
O2 Saturation: 95 %
O2 Saturation: 96 %
O2 Saturation: 87 %
Patient temperature: 36
Patient temperature: 36.4
Potassium: 3.4 mmol/L — ABNORMAL LOW (ref 3.5–5.1)
Potassium: 3.4 mmol/L — ABNORMAL LOW (ref 3.5–5.1)
Potassium: 3.7 mmol/L (ref 3.5–5.1)
Potassium: 3.4 mmol/L — ABNORMAL LOW (ref 3.5–5.1)
Potassium: 4.1 mmol/L (ref 3.5–5.1)
Sodium: 141 mmol/L (ref 135–145)
Sodium: 141 mmol/L (ref 135–145)
Sodium: 141 mmol/L (ref 135–145)
Sodium: 142 mmol/L (ref 135–145)
Sodium: 141 mmol/L (ref 135–145)
TCO2: 27 mmol/L (ref 22–32)
TCO2: 29 mmol/L (ref 22–32)
TCO2: 25 mmol/L (ref 22–32)
TCO2: 27 mmol/L (ref 22–32)
TCO2: 26 mmol/L (ref 22–32)
pCO2 arterial: 42.3 mmHg (ref 32.0–48.0)
pCO2 arterial: 40.1 mmHg (ref 32.0–48.0)
pCO2 arterial: 37.3 mmHg (ref 32.0–48.0)
pCO2 arterial: 48.2 mmHg — ABNORMAL HIGH (ref 32.0–48.0)
pCO2 arterial: 44.4 mmHg (ref 32.0–48.0)
pH, Arterial: 7.394 (ref 7.350–7.450)
pH, Arterial: 7.453 — ABNORMAL HIGH (ref 7.350–7.450)
pH, Arterial: 7.411 (ref 7.350–7.450)
pH, Arterial: 7.327 — ABNORMAL LOW (ref 7.350–7.450)
pH, Arterial: 7.348 — ABNORMAL LOW (ref 7.350–7.450)
pO2, Arterial: 116 mmHg — ABNORMAL HIGH (ref 83.0–108.0)
pO2, Arterial: 293 mmHg — ABNORMAL HIGH (ref 83.0–108.0)
pO2, Arterial: 77 mmHg — ABNORMAL LOW (ref 83.0–108.0)
pO2, Arterial: 82 mmHg — ABNORMAL LOW (ref 83.0–108.0)
pO2, Arterial: 55 mmHg — ABNORMAL LOW (ref 83.0–108.0)

## 2019-06-16 LAB — GLUCOSE, CAPILLARY
Glucose-Capillary: 178 mg/dL — ABNORMAL HIGH (ref 70–99)
Glucose-Capillary: 147 mg/dL — ABNORMAL HIGH (ref 70–99)
Glucose-Capillary: 152 mg/dL — ABNORMAL HIGH (ref 70–99)
Glucose-Capillary: 151 mg/dL — ABNORMAL HIGH (ref 70–99)
Glucose-Capillary: 147 mg/dL — ABNORMAL HIGH (ref 70–99)
Glucose-Capillary: 149 mg/dL — ABNORMAL HIGH (ref 70–99)
Glucose-Capillary: 127 mg/dL — ABNORMAL HIGH (ref 70–99)
Glucose-Capillary: 166 mg/dL — ABNORMAL HIGH (ref 70–99)

## 2019-06-16 LAB — CBC
HCT: 40.7 % (ref 36.0–46.0)
HCT: 35.7 % — ABNORMAL LOW (ref 36.0–46.0)
HCT: 35 % — ABNORMAL LOW (ref 36.0–46.0)
Hemoglobin: 13.2 g/dL (ref 12.0–15.0)
Hemoglobin: 11.5 g/dL — ABNORMAL LOW (ref 12.0–15.0)
Hemoglobin: 11.6 g/dL — ABNORMAL LOW (ref 12.0–15.0)
MCH: 29.7 pg (ref 26.0–34.0)
MCH: 30.2 pg (ref 26.0–34.0)
MCH: 30.4 pg (ref 26.0–34.0)
MCHC: 32.4 g/dL (ref 30.0–36.0)
MCHC: 32.2 g/dL (ref 30.0–36.0)
MCHC: 33.1 g/dL (ref 30.0–36.0)
MCV: 91.7 fL (ref 80.0–100.0)
MCV: 93.7 fL (ref 80.0–100.0)
MCV: 91.9 fL (ref 80.0–100.0)
Platelets: 254 10*3/uL (ref 150–400)
Platelets: 233 10*3/uL (ref 150–400)
Platelets: 246 10*3/uL (ref 150–400)
RBC: 4.44 MIL/uL (ref 3.87–5.11)
RBC: 3.81 MIL/uL — ABNORMAL LOW (ref 3.87–5.11)
RBC: 3.81 MIL/uL — ABNORMAL LOW (ref 3.87–5.11)
RDW: 13.2 % (ref 11.5–15.5)
RDW: 12.9 % (ref 11.5–15.5)
RDW: 12.7 % (ref 11.5–15.5)
WBC: 8.9 10*3/uL (ref 4.0–10.5)
WBC: 23.9 10*3/uL — ABNORMAL HIGH (ref 4.0–10.5)
WBC: 19 10*3/uL — ABNORMAL HIGH (ref 4.0–10.5)
nRBC: 0 % (ref 0.0–0.2)
nRBC: 0 % (ref 0.0–0.2)
nRBC: 0 % (ref 0.0–0.2)

## 2019-06-16 LAB — POCT I-STAT, CHEM 8
BUN: 20 mg/dL (ref 8–23)
BUN: 17 mg/dL (ref 8–23)
BUN: 19 mg/dL (ref 8–23)
BUN: 18 mg/dL (ref 8–23)
BUN: 19 mg/dL (ref 8–23)
Calcium, Ion: 1.25 mmol/L (ref 1.15–1.40)
Calcium, Ion: 1.03 mmol/L — ABNORMAL LOW (ref 1.15–1.40)
Calcium, Ion: 1.23 mmol/L (ref 1.15–1.40)
Calcium, Ion: 1.13 mmol/L — ABNORMAL LOW (ref 1.15–1.40)
Calcium, Ion: 1.18 mmol/L (ref 1.15–1.40)
Chloride: 103 mmol/L (ref 98–111)
Chloride: 103 mmol/L (ref 98–111)
Chloride: 99 mmol/L (ref 98–111)
Chloride: 105 mmol/L (ref 98–111)
Chloride: 104 mmol/L (ref 98–111)
Creatinine, Ser: 0.7 mg/dL (ref 0.44–1.00)
Creatinine, Ser: 0.5 mg/dL (ref 0.44–1.00)
Creatinine, Ser: 0.6 mg/dL (ref 0.44–1.00)
Creatinine, Ser: 0.6 mg/dL (ref 0.44–1.00)
Creatinine, Ser: 0.7 mg/dL (ref 0.44–1.00)
Glucose, Bld: 141 mg/dL — ABNORMAL HIGH (ref 70–99)
Glucose, Bld: 150 mg/dL — ABNORMAL HIGH (ref 70–99)
Glucose, Bld: 166 mg/dL — ABNORMAL HIGH (ref 70–99)
Glucose, Bld: 173 mg/dL — ABNORMAL HIGH (ref 70–99)
Glucose, Bld: 206 mg/dL — ABNORMAL HIGH (ref 70–99)
HCT: 38 % (ref 36.0–46.0)
HCT: 29 % — ABNORMAL LOW (ref 36.0–46.0)
HCT: 34 % — ABNORMAL LOW (ref 36.0–46.0)
HCT: 29 % — ABNORMAL LOW (ref 36.0–46.0)
HCT: 32 % — ABNORMAL LOW (ref 36.0–46.0)
Hemoglobin: 12.9 g/dL (ref 12.0–15.0)
Hemoglobin: 11.6 g/dL — ABNORMAL LOW (ref 12.0–15.0)
Hemoglobin: 9.9 g/dL — ABNORMAL LOW (ref 12.0–15.0)
Hemoglobin: 9.9 g/dL — ABNORMAL LOW (ref 12.0–15.0)
Hemoglobin: 10.9 g/dL — ABNORMAL LOW (ref 12.0–15.0)
Potassium: 3.4 mmol/L — ABNORMAL LOW (ref 3.5–5.1)
Potassium: 3.4 mmol/L — ABNORMAL LOW (ref 3.5–5.1)
Potassium: 3.4 mmol/L — ABNORMAL LOW (ref 3.5–5.1)
Potassium: 4.3 mmol/L (ref 3.5–5.1)
Potassium: 3.8 mmol/L (ref 3.5–5.1)
Sodium: 140 mmol/L (ref 135–145)
Sodium: 140 mmol/L (ref 135–145)
Sodium: 140 mmol/L (ref 135–145)
Sodium: 139 mmol/L (ref 135–145)
Sodium: 141 mmol/L (ref 135–145)
TCO2: 26 mmol/L (ref 22–32)
TCO2: 26 mmol/L (ref 22–32)
TCO2: 29 mmol/L (ref 22–32)
TCO2: 29 mmol/L (ref 22–32)
TCO2: 25 mmol/L (ref 22–32)

## 2019-06-16 LAB — BASIC METABOLIC PANEL
Anion gap: 14 (ref 5–15)
BUN: 20 mg/dL (ref 8–23)
CO2: 24 mmol/L (ref 22–32)
Calcium: 9.4 mg/dL (ref 8.9–10.3)
Chloride: 102 mmol/L (ref 98–111)
Creatinine, Ser: 0.96 mg/dL (ref 0.44–1.00)
GFR calc Af Amer: >60 mL/min (ref >60)
GFR calc non Af Amer: >60 mL/min (ref >60)
Glucose, Bld: 142 mg/dL — ABNORMAL HIGH (ref 70–99)
Potassium: 3.3 mmol/L — ABNORMAL LOW (ref 3.5–5.1)
Sodium: 140 mmol/L (ref 135–145)

## 2019-06-16 LAB — PROTIME-INR
INR: 1.3 — ABNORMAL HIGH (ref 0.8–1.2)
Prothrombin Time: 16.4 seconds — ABNORMAL HIGH (ref 11.4–15.2)

## 2019-06-16 LAB — CREATININE, SERUM
Creatinine, Ser: 0.76 mg/dL (ref 0.44–1.00)
GFR calc Af Amer: >60 mL/min (ref >60)
GFR calc non Af Amer: >60 mL/min (ref >60)

## 2019-06-16 LAB — HEMOGLOBIN AND HEMATOCRIT, BLOOD
HCT: 30.5 % — ABNORMAL LOW (ref 36.0–46.0)
Hemoglobin: 10 g/dL — ABNORMAL LOW (ref 12.0–15.0)

## 2019-06-16 LAB — PLATELET COUNT: Platelets: 203 10*3/uL (ref 150–400)

## 2019-06-16 LAB — APTT: aPTT: 34 seconds (ref 24–36)

## 2019-06-16 LAB — HEPARIN LEVEL (UNFRACTIONATED): Heparin Unfractionated: 0.31 IU/mL (ref 0.30–0.70)

## 2019-06-16 LAB — MAGNESIUM: Magnesium: 2.7 mg/dL — ABNORMAL HIGH (ref 1.7–2.4)

## 2019-06-16 SURGERY — CORONARY ARTERY BYPASS GRAFTING (CABG)
Anesthesia: General | Site: Chest

## 2019-06-16 MED ORDER — MORPHINE SULFATE (PF) 2 MG/ML IV SOLN
1.0000 mg | INTRAVENOUS | Status: DC | PRN
  Administered 2019-06-16 (×2): 2 mg via INTRAVENOUS
  Filled 2019-06-16: qty 2

## 2019-06-16 MED ORDER — PHENYLEPHRINE HCL-NACL 20-0.9 MG/250ML-% IV SOLN
0.0000 ug/min | INTRAVENOUS | Status: DC
  Administered 2019-06-16: 53.333 ug/min via INTRAVENOUS
  Administered 2019-06-17: 45 ug/min via INTRAVENOUS
  Filled 2019-06-16 (×2): qty 250

## 2019-06-16 MED ORDER — BISACODYL 10 MG RE SUPP: 10.0000 mg | Freq: Every day | RECTAL | Status: DC

## 2019-06-16 MED ORDER — TRAMADOL HCL 50 MG PO TABS: 50.0000 mg | ORAL_TABLET | ORAL | Status: DC | PRN

## 2019-06-16 MED ORDER — ROCURONIUM BROMIDE 10 MG/ML (PF) SYRINGE
PREFILLED_SYRINGE | INTRAVENOUS | Status: AC
  Filled 2019-06-16: qty 20

## 2019-06-16 MED ORDER — MIDAZOLAM HCL 2 MG/2ML IJ SOLN: 2.0000 mg | INTRAMUSCULAR | Status: DC | PRN

## 2019-06-16 MED ORDER — THROMBIN (RECOMBINANT) 20000 UNITS EX SOLR
CUTANEOUS | Status: AC
  Filled 2019-06-16: qty 20000

## 2019-06-16 MED ORDER — MILRINONE LACTATE IN DEXTROSE 20-5 MG/100ML-% IV SOLN: 0.2500 ug/kg/min | INTRAVENOUS | Status: DC

## 2019-06-16 MED ORDER — OXYCODONE HCL 5 MG PO TABS
5.0000 mg | ORAL_TABLET | ORAL | Status: DC | PRN
  Administered 2019-06-17 (×3): 10 mg via ORAL
  Administered 2019-06-18: 5 mg via ORAL
  Filled 2019-06-16: qty 2
  Filled 2019-06-16: qty 1
  Filled 2019-06-16 (×2): qty 2

## 2019-06-16 MED ORDER — CHLORHEXIDINE GLUCONATE 0.12 % MT SOLN: 15.0000 mL | OROMUCOSAL | Status: AC

## 2019-06-16 MED ORDER — DOPAMINE-DEXTROSE 3.2-5 MG/ML-% IV SOLN
3.0000 ug/kg/min | INTRAVENOUS | Status: DC
  Administered 2019-06-17: 3 ug/kg/min via INTRAVENOUS
  Filled 2019-06-16: qty 250

## 2019-06-16 MED ORDER — ACETAMINOPHEN 500 MG PO TABS
1000.0000 mg | ORAL_TABLET | Freq: Four times a day (QID) | ORAL | Status: DC
  Administered 2019-06-17 – 2019-06-18 (×8): 1000 mg via ORAL
  Filled 2019-06-16 (×8): qty 2

## 2019-06-16 MED ORDER — NOREPINEPHRINE 4 MG/250ML-% IV SOLN
0.0000 ug/min | INTRAVENOUS | Status: DC
  Filled 2019-06-16: qty 250

## 2019-06-16 MED ORDER — MIDAZOLAM HCL (PF) 10 MG/2ML IJ SOLN
INTRAMUSCULAR | Status: AC
  Filled 2019-06-16: qty 2

## 2019-06-16 MED ORDER — METOPROLOL TARTRATE 5 MG/5ML IV SOLN: 2.5000 mg | INTRAVENOUS | Status: DC | PRN

## 2019-06-16 MED ORDER — DIPHENHYDRAMINE HCL 50 MG/ML IJ SOLN
INTRAMUSCULAR | Status: AC
  Filled 2019-06-16: qty 1

## 2019-06-16 MED ORDER — SODIUM CHLORIDE 0.9% FLUSH: 3.0000 mL | INTRAVENOUS | Status: DC | PRN

## 2019-06-16 MED ORDER — SODIUM CHLORIDE 0.9 % IV SOLN: INTRAVENOUS | Status: DC

## 2019-06-16 MED ORDER — ROCURONIUM BROMIDE 10 MG/ML (PF) SYRINGE
PREFILLED_SYRINGE | INTRAVENOUS | Status: AC
  Filled 2019-06-16: qty 10

## 2019-06-16 MED ORDER — ACETAMINOPHEN 650 MG RE SUPP: 650.0000 mg | Freq: Once | RECTAL | Status: AC

## 2019-06-16 MED ORDER — DEXMEDETOMIDINE HCL IN NACL 200 MCG/50ML IV SOLN
INTRAVENOUS | Status: AC
  Filled 2019-06-16: qty 50

## 2019-06-16 MED ORDER — SODIUM CHLORIDE 0.9 % IV SOLN
250.0000 mL | INTRAVENOUS | Status: DC
  Administered 2019-06-17: 250 mL via INTRAVENOUS

## 2019-06-16 MED ORDER — PROTAMINE SULFATE 10 MG/ML IV SOLN
INTRAVENOUS | Status: DC | PRN
  Administered 2019-06-16 (×4): 50 mg via INTRAVENOUS
  Administered 2019-06-16: 20 mg via INTRAVENOUS
  Administered 2019-06-16: 30 mg via INTRAVENOUS
  Administered 2019-06-16: 100 mg via INTRAVENOUS

## 2019-06-16 MED ORDER — BISACODYL 5 MG PO TBEC
10.0000 mg | DELAYED_RELEASE_TABLET | Freq: Every day | ORAL | Status: DC
  Administered 2019-06-17 – 2019-06-18 (×2): 10 mg via ORAL
  Filled 2019-06-16 (×2): qty 2

## 2019-06-16 MED ORDER — PHENYLEPHRINE 40 MCG/ML (10ML) SYRINGE FOR IV PUSH (FOR BLOOD PRESSURE SUPPORT)
PREFILLED_SYRINGE | INTRAVENOUS | Status: AC
  Filled 2019-06-16: qty 30

## 2019-06-16 MED ORDER — LEVALBUTEROL HCL 0.63 MG/3ML IN NEBU
0.6300 mg | INHALATION_SOLUTION | Freq: Four times a day (QID) | RESPIRATORY_TRACT | Status: DC | PRN
  Administered 2019-06-16: 0.63 mg via RESPIRATORY_TRACT
  Filled 2019-06-16: qty 3

## 2019-06-16 MED ORDER — FAMOTIDINE IN NACL 20-0.9 MG/50ML-% IV SOLN
20.0000 mg | Freq: Two times a day (BID) | INTRAVENOUS | Status: AC
  Administered 2019-06-16: 20 mg via INTRAVENOUS

## 2019-06-16 MED ORDER — LACTATED RINGERS IV SOLN
INTRAVENOUS | Status: DC | PRN
  Administered 2019-06-16: 08:00:00 via INTRAVENOUS

## 2019-06-16 MED ORDER — SODIUM CHLORIDE 0.45 % IV SOLN: INTRAVENOUS | Status: DC | PRN

## 2019-06-16 MED ORDER — MILRINONE LACTATE IN DEXTROSE 20-5 MG/100ML-% IV SOLN
INTRAVENOUS | Status: DC | PRN
  Administered 2019-06-16: 0.25 ug/kg/min via INTRAVENOUS

## 2019-06-16 MED ORDER — PHENYLEPHRINE 40 MCG/ML (10ML) SYRINGE FOR IV PUSH (FOR BLOOD PRESSURE SUPPORT)
PREFILLED_SYRINGE | INTRAVENOUS | Status: AC
  Filled 2019-06-16: qty 10

## 2019-06-16 MED ORDER — FENTANYL CITRATE (PF) 250 MCG/5ML IJ SOLN
INTRAMUSCULAR | Status: DC | PRN
  Administered 2019-06-16: 150 ug via INTRAVENOUS
  Administered 2019-06-16: 50 ug via INTRAVENOUS
  Administered 2019-06-16: 100 ug via INTRAVENOUS
  Administered 2019-06-16: 50 ug via INTRAVENOUS
  Administered 2019-06-16: 100 ug via INTRAVENOUS
  Administered 2019-06-16: 50 ug via INTRAVENOUS
  Administered 2019-06-16: 100 ug via INTRAVENOUS
  Administered 2019-06-16: 50 ug via INTRAVENOUS
  Administered 2019-06-16: 150 ug via INTRAVENOUS
  Administered 2019-06-16: 200 ug via INTRAVENOUS

## 2019-06-16 MED ORDER — METOPROLOL TARTRATE 25 MG/10 ML ORAL SUSPENSION: 12.5000 mg | Freq: Two times a day (BID) | ORAL | Status: DC

## 2019-06-16 MED ORDER — SODIUM CHLORIDE 0.9% FLUSH
3.0000 mL | Freq: Two times a day (BID) | INTRAVENOUS | Status: DC
  Administered 2019-06-17 – 2019-06-18 (×3): 3 mL via INTRAVENOUS

## 2019-06-16 MED ORDER — MILRINONE LACTATE IN DEXTROSE 20-5 MG/100ML-% IV SOLN
0.1250 ug/kg/min | INTRAVENOUS | Status: DC
  Administered 2019-06-16 – 2019-06-18 (×3): 0.25 ug/kg/min via INTRAVENOUS
  Filled 2019-06-16 (×4): qty 100

## 2019-06-16 MED ORDER — INSULIN REGULAR(HUMAN) IN NACL 100-0.9 UT/100ML-% IV SOLN: INTRAVENOUS | Status: DC

## 2019-06-16 MED ORDER — ASPIRIN EC 325 MG PO TBEC
325.0000 mg | DELAYED_RELEASE_TABLET | Freq: Every day | ORAL | Status: DC
  Administered 2019-06-17 – 2019-06-18 (×2): 325 mg via ORAL
  Filled 2019-06-16 (×2): qty 1

## 2019-06-16 MED ORDER — ORAL CARE MOUTH RINSE
15.0000 mL | Freq: Two times a day (BID) | OROMUCOSAL | Status: DC
  Administered 2019-06-18 (×2): 15 mL via OROMUCOSAL

## 2019-06-16 MED ORDER — MIDAZOLAM HCL 2 MG/2ML IJ SOLN
INTRAMUSCULAR | Status: AC
  Filled 2019-06-16: qty 2

## 2019-06-16 MED ORDER — ROCURONIUM BROMIDE 10 MG/ML (PF) SYRINGE
PREFILLED_SYRINGE | INTRAVENOUS | Status: DC | PRN
  Administered 2019-06-16: 50 mg via INTRAVENOUS
  Administered 2019-06-16: 100 mg via INTRAVENOUS
  Administered 2019-06-16: 50 mg via INTRAVENOUS
  Administered 2019-06-16: 100 mg via INTRAVENOUS

## 2019-06-16 MED ORDER — HEPARIN SODIUM (PORCINE) 1000 UNIT/ML IJ SOLN
INTRAMUSCULAR | Status: DC | PRN
  Administered 2019-06-16: 38000 [IU] via INTRAVENOUS

## 2019-06-16 MED ORDER — METOPROLOL TARTRATE 12.5 MG HALF TABLET: 12.5000 mg | ORAL_TABLET | Freq: Two times a day (BID) | ORAL | Status: DC

## 2019-06-16 MED ORDER — DEXMEDETOMIDINE HCL IN NACL 400 MCG/100ML IV SOLN
0.0000 ug/kg/h | INTRAVENOUS | Status: DC
  Administered 2019-06-16: 0.7 ug/kg/h via INTRAVENOUS
  Administered 2019-06-17: 0.1 ug/kg/h via INTRAVENOUS
  Filled 2019-06-16: qty 100

## 2019-06-16 MED ORDER — ONDANSETRON HCL 4 MG/2ML IJ SOLN
INTRAMUSCULAR | Status: AC
  Filled 2019-06-16: qty 2

## 2019-06-16 MED ORDER — LIDOCAINE HCL (CARDIAC) PF 100 MG/5ML IV SOSY
PREFILLED_SYRINGE | INTRAVENOUS | Status: DC | PRN
  Administered 2019-06-16: 60 mg via INTRAVENOUS

## 2019-06-16 MED ORDER — POTASSIUM CHLORIDE 10 MEQ/50ML IV SOLN
10.0000 meq | INTRAVENOUS | Status: AC
  Administered 2019-06-16 (×3): 10 meq via INTRAVENOUS

## 2019-06-16 MED ORDER — FENTANYL CITRATE (PF) 250 MCG/5ML IJ SOLN
INTRAMUSCULAR | Status: AC
  Filled 2019-06-16: qty 25

## 2019-06-16 MED ORDER — NITROGLYCERIN IN D5W 200-5 MCG/ML-% IV SOLN: 0.0000 ug/min | INTRAVENOUS | Status: DC

## 2019-06-16 MED ORDER — PHENYLEPHRINE HCL (PRESSORS) 10 MG/ML IV SOLN
INTRAVENOUS | Status: AC
  Filled 2019-06-16: qty 1

## 2019-06-16 MED ORDER — ONDANSETRON HCL 4 MG/2ML IJ SOLN: 4.0000 mg | Freq: Four times a day (QID) | INTRAMUSCULAR | Status: DC | PRN

## 2019-06-16 MED ORDER — MAGNESIUM SULFATE 4 GM/100ML IV SOLN
4.0000 g | Freq: Once | INTRAVENOUS | Status: AC
  Administered 2019-06-16: 4 g via INTRAVENOUS
  Filled 2019-06-16: qty 100

## 2019-06-16 MED ORDER — MIDAZOLAM HCL 5 MG/5ML IJ SOLN
INTRAMUSCULAR | Status: DC | PRN
  Administered 2019-06-16: 1 mg via INTRAVENOUS
  Administered 2019-06-16: 2 mg via INTRAVENOUS
  Administered 2019-06-16 (×2): 1 mg via INTRAVENOUS
  Administered 2019-06-16 (×2): 2 mg via INTRAVENOUS
  Administered 2019-06-16: 1 mg via INTRAVENOUS
  Administered 2019-06-16: 2 mg via INTRAVENOUS

## 2019-06-16 MED ORDER — CHLORHEXIDINE GLUCONATE CLOTH 2 % EX PADS
6.0000 | MEDICATED_PAD | Freq: Every day | CUTANEOUS | Status: DC
  Administered 2019-06-18: 6 via TOPICAL

## 2019-06-16 MED ORDER — PROPOFOL 10 MG/ML IV BOLUS
INTRAVENOUS | Status: DC | PRN
  Administered 2019-06-16: 40 mg via INTRAVENOUS
  Administered 2019-06-16: 20 mg via INTRAVENOUS

## 2019-06-16 MED ORDER — ARTIFICIAL TEARS OPHTHALMIC OINT
TOPICAL_OINTMENT | OPHTHALMIC | Status: DC | PRN
  Administered 2019-06-16: 1 via OPHTHALMIC

## 2019-06-16 MED ORDER — ALBUMIN HUMAN 5 % IV SOLN
INTRAVENOUS | Status: DC | PRN
  Administered 2019-06-16 (×3): via INTRAVENOUS

## 2019-06-16 MED ORDER — EPHEDRINE SULFATE 50 MG/ML IJ SOLN
INTRAMUSCULAR | Status: DC | PRN
  Administered 2019-06-16 (×2): 10 mg via INTRAVENOUS

## 2019-06-16 MED ORDER — ASPIRIN 81 MG PO CHEW: 324.0000 mg | CHEWABLE_TABLET | Freq: Every day | ORAL | Status: DC

## 2019-06-16 MED ORDER — ACETAMINOPHEN 160 MG/5ML PO SOLN
650.0000 mg | Freq: Once | ORAL | Status: AC
  Administered 2019-06-16: 650 mg

## 2019-06-16 MED ORDER — DIPHENHYDRAMINE HCL 50 MG/ML IJ SOLN
INTRAMUSCULAR | Status: DC | PRN
  Administered 2019-06-16: 25 mg via INTRAVENOUS

## 2019-06-16 MED ORDER — DEXTROSE 50 % IV SOLN: 0.0000 mL | INTRAVENOUS | Status: DC | PRN

## 2019-06-16 MED ORDER — LACTATED RINGERS IV SOLN: INTRAVENOUS | Status: DC

## 2019-06-16 MED ORDER — FUROSEMIDE 10 MG/ML IJ SOLN
40.0000 mg | Freq: Once | INTRAMUSCULAR | Status: AC
  Administered 2019-06-16: 40 mg via INTRAVENOUS

## 2019-06-16 MED ORDER — HEMOSTATIC AGENTS (NO CHARGE) OPTIME
TOPICAL | Status: DC | PRN
  Administered 2019-06-16: 1 via TOPICAL

## 2019-06-16 MED ORDER — ACETAMINOPHEN 160 MG/5ML PO SOLN: 1000.0000 mg | Freq: Four times a day (QID) | ORAL | Status: DC

## 2019-06-16 MED ORDER — PROPOFOL 10 MG/ML IV BOLUS
INTRAVENOUS | Status: AC
  Filled 2019-06-16: qty 20

## 2019-06-16 MED ORDER — LACTATED RINGERS IV SOLN: 500.0000 mL | Freq: Once | INTRAVENOUS | Status: DC | PRN

## 2019-06-16 MED ORDER — THROMBIN 20000 UNITS EX SOLR
CUTANEOUS | Status: DC | PRN
  Administered 2019-06-16: 08:00:00 20000 [IU] via TOPICAL

## 2019-06-16 MED ORDER — LACTATED RINGERS IV SOLN
INTRAVENOUS | Status: DC | PRN
  Administered 2019-06-16: 07:00:00 via INTRAVENOUS

## 2019-06-16 MED ORDER — PANTOPRAZOLE SODIUM 40 MG PO TBEC
40.0000 mg | DELAYED_RELEASE_TABLET | Freq: Every day | ORAL | Status: DC
  Administered 2019-06-18: 40 mg via ORAL
  Filled 2019-06-16: qty 1

## 2019-06-16 MED ORDER — PHENYLEPHRINE HCL (PRESSORS) 10 MG/ML IV SOLN
INTRAVENOUS | Status: DC | PRN
  Administered 2019-06-16: 80 ug via INTRAVENOUS
  Administered 2019-06-16: 40 ug via INTRAVENOUS

## 2019-06-16 MED ORDER — HEMOSTATIC AGENTS (NO CHARGE) OPTIME
TOPICAL | Status: DC | PRN
  Administered 2019-06-16 (×3): 1 via TOPICAL

## 2019-06-16 MED ORDER — VANCOMYCIN HCL IN DEXTROSE 1-5 GM/200ML-% IV SOLN
1000.0000 mg | Freq: Once | INTRAVENOUS | Status: AC
  Administered 2019-06-16: 1000 mg via INTRAVENOUS
  Filled 2019-06-16: qty 200

## 2019-06-16 MED ORDER — 0.9 % SODIUM CHLORIDE (POUR BTL) OPTIME
TOPICAL | Status: DC | PRN
  Administered 2019-06-16: 07:00:00 5000 mL

## 2019-06-16 MED ORDER — LACTATED RINGERS IV SOLN
INTRAVENOUS | Status: DC | PRN
  Administered 2019-06-16 (×2): via INTRAVENOUS

## 2019-06-16 MED ORDER — CHLORHEXIDINE GLUCONATE 0.12 % MT SOLN
15.0000 mL | Freq: Two times a day (BID) | OROMUCOSAL | Status: DC
  Administered 2019-06-16 – 2019-06-18 (×3): 15 mL via OROMUCOSAL
  Filled 2019-06-16 (×3): qty 15

## 2019-06-16 MED ORDER — PLASMA-LYTE 148 IV SOLN
INTRAVENOUS | Status: DC | PRN
  Administered 2019-06-16: 500 mL via INTRAVASCULAR

## 2019-06-16 MED ORDER — STERILE WATER FOR IRRIGATION IR SOLN
Status: DC | PRN
  Administered 2019-06-16: 2000 mL

## 2019-06-16 MED ORDER — EPHEDRINE 5 MG/ML INJ
INTRAVENOUS | Status: AC
  Filled 2019-06-16: qty 10

## 2019-06-16 MED ORDER — PROTAMINE SULFATE 10 MG/ML IV SOLN
INTRAVENOUS | Status: AC
  Filled 2019-06-16: qty 50

## 2019-06-16 MED ORDER — DOCUSATE SODIUM 100 MG PO CAPS
200.0000 mg | ORAL_CAPSULE | Freq: Every day | ORAL | Status: DC
  Administered 2019-06-17 – 2019-06-18 (×2): 200 mg via ORAL
  Filled 2019-06-16 (×2): qty 2

## 2019-06-16 MED ORDER — SODIUM CHLORIDE 0.9 % IV SOLN
1.5000 g | Freq: Two times a day (BID) | INTRAVENOUS | Status: AC
  Administered 2019-06-16 – 2019-06-18 (×4): 1.5 g via INTRAVENOUS
  Filled 2019-06-16 (×5): qty 1.5

## 2019-06-16 MED ORDER — ALBUMIN HUMAN 5 % IV SOLN
250.0000 mL | INTRAVENOUS | Status: DC | PRN
  Administered 2019-06-16 (×2): 12.5 g via INTRAVENOUS

## 2019-06-16 SURGICAL SUPPLY — 107 items
ADH SKN CLS APL DERMABOND .7 (GAUZE/BANDAGES/DRESSINGS) ×2
BAG DECANTER FOR FLEXI CONT (MISCELLANEOUS) ×4 IMPLANT
BASKET HEART  (ORDER IN 25'S) (MISCELLANEOUS) ×1
BASKET HEART (ORDER IN 25'S) (MISCELLANEOUS) ×1
BASKET HEART (ORDER IN 25S) (MISCELLANEOUS) ×2 IMPLANT
BLADE CLIPPER SURG (BLADE) ×2 IMPLANT
BLADE STERNUM SYSTEM 6 (BLADE) ×4 IMPLANT
BNDG ELASTIC 4X5.8 VLCR STR LF (GAUZE/BANDAGES/DRESSINGS) ×4 IMPLANT
BNDG ELASTIC 6X5.8 VLCR STR LF (GAUZE/BANDAGES/DRESSINGS) ×4 IMPLANT
BNDG GAUZE ELAST 4 BULKY (GAUZE/BANDAGES/DRESSINGS) ×4 IMPLANT
CANISTER SUCT 3000ML PPV (MISCELLANEOUS) ×4 IMPLANT
CATH ROBINSON RED A/P 18FR (CATHETERS) ×8 IMPLANT
CATH THORACIC 28FR (CATHETERS) ×6 IMPLANT
CATH THORACIC 36FR (CATHETERS) ×4 IMPLANT
CATH THORACIC 36FR RT ANG (CATHETERS) ×4 IMPLANT
CLIP VESOCCLUDE MED 24/CT (CLIP) IMPLANT
CLIP VESOCCLUDE SM WIDE 24/CT (CLIP) ×2 IMPLANT
COVER WAND RF STERILE (DRAPES) ×2 IMPLANT
DERMABOND ADVANCED (GAUZE/BANDAGES/DRESSINGS) ×2
DERMABOND ADVANCED .7 DNX12 (GAUZE/BANDAGES/DRESSINGS) IMPLANT
DRAPE CARDIOVASCULAR INCISE (DRAPES) ×4
DRAPE SLUSH/WARMER DISC (DRAPES) ×4 IMPLANT
DRAPE SRG 135X102X78XABS (DRAPES) ×2 IMPLANT
DRSG AQUACEL AG ADV 3.5X14 (GAUZE/BANDAGES/DRESSINGS) ×2 IMPLANT
DRSG COVADERM 4X14 (GAUZE/BANDAGES/DRESSINGS) ×4 IMPLANT
ELECT CAUTERY BLADE 6.4 (BLADE) ×4 IMPLANT
ELECT REM PT RETURN 9FT ADLT (ELECTROSURGICAL) ×8
ELECTRODE REM PT RTRN 9FT ADLT (ELECTROSURGICAL) ×4 IMPLANT
FELT TEFLON 1X6 (MISCELLANEOUS) ×8 IMPLANT
GAUZE SPONGE 4X4 12PLY STRL (GAUZE/BANDAGES/DRESSINGS) ×8 IMPLANT
GLOVE BIO SURGEON STRL SZ 6 (GLOVE) ×12 IMPLANT
GLOVE BIO SURGEON STRL SZ 6.5 (GLOVE) ×4 IMPLANT
GLOVE BIO SURGEON STRL SZ7 (GLOVE) IMPLANT
GLOVE BIO SURGEON STRL SZ7.5 (GLOVE) ×4 IMPLANT
GLOVE BIO SURGEONS STRL SZ 6.5 (GLOVE) ×4
GLOVE BIOGEL PI IND STRL 6 (GLOVE) IMPLANT
GLOVE BIOGEL PI IND STRL 6.5 (GLOVE) IMPLANT
GLOVE BIOGEL PI IND STRL 7.0 (GLOVE) IMPLANT
GLOVE BIOGEL PI INDICATOR 6 (GLOVE) ×2
GLOVE BIOGEL PI INDICATOR 6.5 (GLOVE) ×8
GLOVE BIOGEL PI INDICATOR 7.0 (GLOVE) ×2
GLOVE EUDERMIC 7 POWDERFREE (GLOVE) ×8 IMPLANT
GLOVE ORTHO TXT STRL SZ7.5 (GLOVE) IMPLANT
GOWN STRL REUS W/ TWL LRG LVL3 (GOWN DISPOSABLE) ×8 IMPLANT
GOWN STRL REUS W/ TWL XL LVL3 (GOWN DISPOSABLE) ×2 IMPLANT
GOWN STRL REUS W/TWL LRG LVL3 (GOWN DISPOSABLE) ×32
GOWN STRL REUS W/TWL XL LVL3 (GOWN DISPOSABLE) ×4
HEMOSTAT POWDER SURGIFOAM 1G (HEMOSTASIS) ×12 IMPLANT
HEMOSTAT SURGICEL 2X14 (HEMOSTASIS) ×4 IMPLANT
INSERT FOGARTY 61MM (MISCELLANEOUS) IMPLANT
INSERT FOGARTY XLG (MISCELLANEOUS) IMPLANT
KIT BASIN OR (CUSTOM PROCEDURE TRAY) ×4 IMPLANT
KIT CATH CPB BARTLE (MISCELLANEOUS) ×4 IMPLANT
KIT SUCTION CATH 14FR (SUCTIONS) ×4 IMPLANT
KIT TURNOVER KIT B (KITS) ×4 IMPLANT
KIT VASOVIEW HEMOPRO 2 VH 4000 (KITS) ×4 IMPLANT
NS IRRIG 1000ML POUR BTL (IV SOLUTION) ×20 IMPLANT
PACK E OPEN HEART (SUTURE) ×4 IMPLANT
PACK OPEN HEART (CUSTOM PROCEDURE TRAY) ×4 IMPLANT
PAD ARMBOARD 7.5X6 YLW CONV (MISCELLANEOUS) ×8 IMPLANT
PAD ELECT DEFIB RADIOL ZOLL (MISCELLANEOUS) ×4 IMPLANT
PENCIL BUTTON HOLSTER BLD 10FT (ELECTRODE) ×4 IMPLANT
POSITIONER HEAD DONUT 9IN (MISCELLANEOUS) ×4 IMPLANT
PUNCH AORTIC ROTATE  4.5MM 8IN (MISCELLANEOUS) ×2 IMPLANT
PUNCH AORTIC ROTATE 4.0MM (MISCELLANEOUS) IMPLANT
PUNCH AORTIC ROTATE 4.5MM 8IN (MISCELLANEOUS) ×4 IMPLANT
PUNCH AORTIC ROTATE 5MM 8IN (MISCELLANEOUS) IMPLANT
SET CARDIOPLEGIA MPS 5001102 (MISCELLANEOUS) ×2 IMPLANT
SPONGE INTESTINAL PEANUT (DISPOSABLE) IMPLANT
SPONGE LAP 18X18 RF (DISPOSABLE) ×2 IMPLANT
SPONGE LAP 4X18 RFD (DISPOSABLE) ×2 IMPLANT
SUT BONE WAX W31G (SUTURE) ×4 IMPLANT
SUT MNCRL AB 4-0 PS2 18 (SUTURE) IMPLANT
SUT PROLENE 3 0 SH DA (SUTURE) IMPLANT
SUT PROLENE 3 0 SH1 36 (SUTURE) ×4 IMPLANT
SUT PROLENE 4 0 RB 1 (SUTURE)
SUT PROLENE 4 0 SH DA (SUTURE) IMPLANT
SUT PROLENE 4-0 RB1 .5 CRCL 36 (SUTURE) IMPLANT
SUT PROLENE 5 0 C 1 36 (SUTURE) IMPLANT
SUT PROLENE 6 0 C 1 30 (SUTURE) IMPLANT
SUT PROLENE 7 0 BV 1 (SUTURE) ×2 IMPLANT
SUT PROLENE 7 0 BV1 MDA (SUTURE) ×4 IMPLANT
SUT PROLENE 8 0 BV175 6 (SUTURE) IMPLANT
SUT SILK  1 MH (SUTURE) ×2
SUT SILK 1 MH (SUTURE) IMPLANT
SUT SILK 2 0 SH (SUTURE) IMPLANT
SUT SILK 2 0 SH CR/8 (SUTURE) ×2 IMPLANT
SUT STEEL STERNAL CCS#1 18IN (SUTURE) IMPLANT
SUT STEEL SZ 6 DBL 3X14 BALL (SUTURE) ×6 IMPLANT
SUT VIC AB 1 CTX 36 (SUTURE) ×8
SUT VIC AB 1 CTX36XBRD ANBCTR (SUTURE) ×4 IMPLANT
SUT VIC AB 2-0 CT1 27 (SUTURE) ×4
SUT VIC AB 2-0 CT1 TAPERPNT 27 (SUTURE) IMPLANT
SUT VIC AB 2-0 CTX 27 (SUTURE) IMPLANT
SUT VIC AB 3-0 SH 27 (SUTURE)
SUT VIC AB 3-0 SH 27X BRD (SUTURE) IMPLANT
SUT VIC AB 3-0 X1 27 (SUTURE) ×2 IMPLANT
SUT VICRYL 4-0 PS2 18IN ABS (SUTURE) IMPLANT
SYSTEM SAHARA CHEST DRAIN ATS (WOUND CARE) ×6 IMPLANT
TAPE CLOTH SURG 4X10 WHT LF (GAUZE/BANDAGES/DRESSINGS) ×2 IMPLANT
TAPE PAPER 2X10 WHT MICROPORE (GAUZE/BANDAGES/DRESSINGS) ×2 IMPLANT
TOWEL GREEN STERILE (TOWEL DISPOSABLE) ×4 IMPLANT
TOWEL GREEN STERILE FF (TOWEL DISPOSABLE) ×4 IMPLANT
TRAY FOLEY SLVR 16FR TEMP STAT (SET/KITS/TRAYS/PACK) ×4 IMPLANT
TUBING LAP HI FLOW INSUFFLATIO (TUBING) ×4 IMPLANT
UNDERPAD 30X30 (UNDERPADS AND DIAPERS) ×4 IMPLANT
WATER STERILE IRR 1000ML POUR (IV SOLUTION) ×8 IMPLANT

## 2019-06-16 NOTE — Progress Notes (Signed)
Echocardiogram Echocardiogram Transesophageal has been performed.  Graesyn Schreifels F Kymberlee Viger 05/28/2019, 1:44 PM 

## 2019-06-16 NOTE — Op Note (Signed)
CARDIOVASCULAR SURGERY OPERATIVE NOTE  06/05/2019  Surgeon:  Alleen Borne, MD  First Assistant: Gershon Crane,  PA-C   Preoperative Diagnosis:  Severe multi-vessel coronary artery disease   Postoperative Diagnosis:  Same   Procedure:  1. Median Sternotomy 2. Extracorporeal circulation 3.   Coronary artery bypass grafting x 3   Left internal mammary graft to the LAD  SVG to OM  SVG to RCA  4.   Endoscopic vein harvest from the right leg   Anesthesia:  General Endotracheal   Clinical History/Surgical Indication:  This 68 year old newly diagnosed diabetic woman has severe 3 vessel coronary artery disease presenting with a NSTEMI and acute on chronic systolic heart failure with an EF of 30-35%, elevated BNP of 465, and pulmonary edema on CXR. I have personally reviewed her echo and cath studies. She has severe 3-vessel coronary artery disease with tandem high grade LAD stenoses, 70% LCX stenosis and an occluded RCA with left to right collaterals. I agree that CABG is the best treatment for her. I discussed the operative procedure with the patient including alternatives, benefits and risks; including but not limited to bleeding, blood transfusion, infection, stroke, myocardial infarction, graft failure, heart block requiring a permanent pacemaker, organ dysfunction, and death.  Coralyn BRYLI MANTEY understands and agrees to proceed.    Preparation:  The patient was seen in the preoperative holding area and the correct patient, correct operation were confirmed with the patient after reviewing the medical record and catheterization. The consent was signed by me. Preoperative antibiotics were given. A pulmonary arterial line and radial arterial line were placed by the anesthesia team. The patient was taken back to the operating room and positioned supine on the operating room table. After being  placed under general endotracheal anesthesia by the anesthesia team a foley catheter was placed. The neck, chest, abdomen, and both legs were prepped with betadine soap and solution and draped in the usual sterile manner. A surgical time-out was taken and the correct patient and operative procedure were confirmed with the nursing and anesthesia staff.  TEE: performed by Dr. Corky Sox. This showed an LVEF of 35%. There was moderate central MR and TR. There was some thickening and calcification of the aortic valve with mild AS. RV function normal.  Cardiopulmonary Bypass:  A median sternotomy was performed. The pericardium was opened in the midline. Right ventricular function appeared normal. The ascending aorta was of normal size and had no palpable plaque. There were no contraindications to aortic cannulation or cross-clamping. The patient was fully systemically heparinized and the ACT was maintained > 400 sec. The proximal aortic arch was cannulated with a 20 F aortic cannula for arterial inflow. Venous cannulation was performed via the right atrial appendage using a two-staged venous cannula. An antegrade cardioplegia/vent cannula was inserted into the mid-ascending aorta. Aortic occlusion was performed with a single cross-clamp. Systemic cooling to 32 degrees Centigrade and topical cooling of the heart with iced saline were used. Hyperkalemic antegrade cold blood cardioplegia was used to induce diastolic arrest and was then given at about 20 minute intervals throughout the period of arrest to maintain myocardial temperature at or below 10 degrees centigrade. A temperature probe was inserted into the interventricular septum and an insulating pad was placed in the pericardium.   Left internal mammary harvest:  The left side of the sternum was retracted using the Rultract retractor. The left internal mammary artery was harvested as a pedicle graft. All side branches were clipped.  It was a medium-sized  vessel of good quality with excellent blood flow. It was ligated distally and divided. It was sprayed with topical papaverine solution to prevent vasospasm.   Endoscopic vein harvest:  The right greater saphenous vein was harvested endoscopically through a 2 cm incision medial to the right knee. It was harvested from the upper thigh to below the knee. It was a medium-sized vein of good quality. The side branches were all ligated with 4-0 silk ties.    Coronary arteries:  The coronary arteries were examined.   LAD:  Deep in epicardial fat throughout its course and exited to the surface at apex where it was small. I was able to locate it in the mid portion deep in the fat and it was a medium caliber vessel with no disease at this location.   LCX:  Medium sized OM that was diffusely diseased but graftable distally.   RCA:  Small vessel that was graftable in the mid portion.   Grafts:  1. LIMA to the LAD: 1.75 mm. It was sewn end to side using 8-0 prolene continuous suture. 2. SVG to OM:  1.6 mm. It was sewn end to side using 7-0 prolene continuous suture. 3. SVG to RCA:  1.6 mm. It was sewn end to side using 7-0 prolene continuous suture.   The proximal vein graft anastomoses were performed to the mid-ascending aorta using continuous 6-0 prolene suture. Graft markers were placed around the proximal anastomoses.   Completion:  The patient was rewarmed to 37 degrees Centigrade. The clamp was removed from the LIMA pedicle and there was rapid warming of the septum and return of ventricular fibrillation. The crossclamp was removed with a time of 95 minutes. There was spontaneous return of sinus rhythm. The distal and proximal anastomoses were checked for hemostasis. The position of the grafts was satisfactory. Two temporary epicardial pacing wires were placed on the right atrium and two on the right ventricle. The patient was weaned from CPB without difficulty on milrinone 0.25 and dopamine  3.  CPB time was 115 minutes. Cardiac output was 6 LPM. TEE showed improved LV systolic function with mild MR. Heparin was fully reversed with protamine and the aortic and venous cannulas removed. Hemostasis was achieved. Mediastinal and bilateral pleural drainage tubes were placed. The sternum was closed with double #6 stainless steel wires. The fascia was closed with continuous # 1 vicryl suture. The subcutaneous tissue was closed with 2-0 vicryl continuous suture. The skin was closed with 3-0 vicryl subcuticular suture. All sponge, needle, and instrument counts were reported correct at the end of the case. Dry sterile dressings were placed over the incisions and around the chest tubes which were connected to pleurevac suction. The patient was then transported to the surgical intensive care unit in stable condition.

## 2019-06-16 NOTE — Procedures (Signed)
Extubation Procedure Note  Patient Details:   Name: JETAIME PINNIX DOB: Jul 21, 1951 MRN: [id number]   Airway Documentation:  Airway 8 mm (Active)  Secured at (cm) 22 cm 05/26/2019 1650  Measured From Lips 06/05/2019 1650  Secured Location Right 06/12/2019 1650  Secured By [employer] Tape 05/28/2019 1650  Cuff Pressure (cm H2O) 28 cm H2O 06/19/2019 1340  Site Condition Dry 06/11/2019 1650   Vent end date: 06/12/2019 Vent end time: 1927   Evaluation  O2 sats: stable throughout  Complications: No apparent complications Patient did tolerate procedure well. Bilateral Breath Sounds: Other (Comment)(Coarse)   Yes  Wyman Songster [facility] 06/03/2019, 7:45 PM

## 2019-06-16 NOTE — Progress Notes (Signed)
Pt started  to desat in mid 80's placed on high flow salter nasal cannula to improve saturation.

## 2019-06-16 NOTE — Anesthesia Procedure Notes (Signed)
Procedure Name: Intubation Date/Time: 06/03/2019 7:54 AM Performed by: Neldon Newport, CRNA Pre-anesthesia Checklist: Timeout performed, Patient being monitored, Suction available, Emergency Drugs available and Patient identified Patient Re-evaluated:Patient Re-evaluated prior to induction Oxygen Delivery Method: Circle system utilized Preoxygenation: Pre-oxygenation with 100% oxygen Induction Type: IV induction Ventilation: Mask ventilation without difficulty and Oral airway inserted - appropriate to patient size Laryngoscope Size: Mac and 3 Grade View: Grade I Tube type: Oral Tube size: 8.0 mm Number of attempts: 1 Placement Confirmation: breath sounds checked- equal and bilateral,  positive ETCO2 and ETT inserted through vocal cords under direct vision Secured at: 22 cm Tube secured with: Tape Dental Injury: Teeth and Oropharynx as per pre-operative assessment

## 2019-06-16 NOTE — Anesthesia Procedure Notes (Signed)
Arterial Line Insertion Start/End11/25/2020 7:10 AM, 06/20/2019 7:20 AM Performed by: Neldon Newport, CRNA  Patient location: Pre-op. Preanesthetic checklist: patient identified, IV checked, site marked, risks and benefits discussed, surgical consent, monitors and equipment checked, pre-op evaluation and timeout performed Patient sedated Left, radial was placed Catheter size: 20 G Hand hygiene performed  and maximum sterile barriers used   Attempts: 1 Procedure performed without using ultrasound guided technique. Following insertion, dressing applied and Biopatch. Post procedure assessment: normal and unchanged  Patient tolerated the procedure well with no immediate complications.

## 2019-06-16 NOTE — Progress Notes (Signed)
Patient ID: Nicole Moreno, female   DOB: [birth year], 68 y.o.   MRN: [id number] EVENING ROUNDS NOTE :     [address]       [place] [address]             [phone]                 Day of Surgery Procedure(s) (LRB): CORONARY ARTERY BYPASS GRAFTING (CABG) X3  ON PUMP USING LEFT INTERNAL MAMMARY ARTERY AND RIGHT GREATER SAPHENOUS ENDOSCOPICALLY HARVESTED VEIN GRAFTS (N/A) TRANSESOPHAGEAL ECHOCARDIOGRAM (TEE) (N/A)  Total Length of Stay:  LOS: 3 days  BP (!) 99/58   Pulse 87   Temp (!) 97.5 F (36.4 C)   Resp (!) 24   Ht 5' 6.5" (1.689 m)   Wt 109 kg   SpO2 92%   BMI 38.20 kg/m   .Intake/Output      11/24 0701 - 11/25 0700   P.O.    I.V. (mL/kg) 3315.8 (30.4)   Blood 250   IV Piggyback 1646   Total Intake(mL/kg) 5211.9 (47.8)   Urine (mL/kg/hr) 1220 (0.9)   Blood 700   Chest Tube 150   Total Output 2070   Net +3141.9         . sodium chloride Stopped (06/20/2019 1721)  . [START ON 06/17/2019] sodium chloride    . sodium chloride 10 mL/hr at 06/20/2019 1400  . albumin human 12.5 g (06/11/2019 1349)  . cefUROXime (ZINACEF)  IV    . dexmedetomidine (PRECEDEX) IV infusion Stopped (06/19/2019 1740)  . DOPamine 3 mcg/kg/min (06/17/2019 1800)  . famotidine (PEPCID) IV Stopped (06/02/2019 1435)  . insulin 2.4 mL/hr at 06/11/2019 1800  . lactated ringers    . lactated ringers Stopped (05/27/2019 1327)  . lactated ringers 20 mL/hr at 05/25/2019 1800  . milrinone 0.25 mcg/kg/min (05/27/2019 1800)  . nitroGLYCERIN 0 mcg/min (06/12/2019 1327)  . phenylephrine (NEO-SYNEPHRINE) Adult infusion 53.333 mcg/min (06/21/2019 1800)  . vancomycin       Lab Results  Component Value Date   WBC 23.9 (H) 06/22/2019   HGB 10.9 (L) 06/03/2019   HCT 32.0 (L) 06/09/2019   PLT 233 06/10/2019   GLUCOSE 206 (H) 05/26/2019   CHOL 257 (H) 06-27-2019   TRIG 244 (H) 06-27-19   HDL 37 (L) 2019/06/27   LDLCALC 171 (H) 06/27/2019   ALT 51 (H) 06-27-2019   AST 29 06-27-19   NA 141  05/24/2019   K 4.1 06/03/2019   CL 104 06/17/2019   CREATININE 0.70 06/11/2019   BUN 19 06/12/2019   CO2 24 05/31/2019   INR 1.3 (H) 05/27/2019   HGBA1C 6.6 (H) 27-Jun-2019   Awake on vent Has chronic copd baseline po2 59 weaning to extubation , may need bipap     Grace Isaac MD  Beeper [phone] Office [phone] 06/04/2019 7:05 PM

## 2019-06-16 NOTE — Brief Op Note (Signed)
07/05/19 - 06/06/2019  11:23 AM  PATIENT:  Nicole Moreno  68 y.o. female  PRE-OPERATIVE DIAGNOSIS:  CORONARY ARTERY DISEASE  POST-OPERATIVE DIAGNOSIS:  CORONARY ARTERY DISEASE  PROCEDURE:  Procedure(s): CORONARY ARTERY BYPASS GRAFTING (CABG) X  ON PUMP USING LEFT INTERNAL MAMMARY ARTERY AND RIGHT GREATER SAPHENOUS ENDOSCOPICALLY HARVESTED VEIN GRAFTS (N/A) TRANSESOPHAGEAL ECHOCARDIOGRAM (TEE) (N/A) LIMA-LAD SVG-OM SVG-RCA  SURGEON:  Surgeon(s) and Role:    * Bartle, Fernande Boyden, MD - Primary  PHYSICIAN ASSISTANT: WAYNE GOLD PA-C  ANESTHESIA:   general  EBL:  200 mL   BLOOD ADMINISTERED:none  DRAINS: LEFT PLEURAL AND PERICARDIAL CHEST TUBES   LOCAL MEDICATIONS USED:  NONE  SPECIMEN:  No Specimen  DISPOSITION OF SPECIMEN:  N/A  COUNTS:  YES  TOURNIQUET:  * No tourniquets in log *  DICTATION: .Dragon Dictation  PLAN OF CARE: Admit to inpatient   PATIENT DISPOSITION:  ICU - intubated and hemodynamically stable.   Delay start of Pharmacological VTE agent (>24hrs) due to surgical blood loss or risk of bleeding: yes  COMPLICATIONS: NO KNOWN

## 2019-06-16 NOTE — Progress Notes (Signed)
Pt extubated to 5 lpm  Per DR Pia Mau.  Patient tolerating well at this time.  Bipap at beside in case patient needs.

## 2019-06-16 NOTE — Transfer of Care (Signed)
Immediate Anesthesia Transfer of Care Note  Patient: Nicole Moreno  Procedure(s) Performed: CORONARY ARTERY BYPASS GRAFTING (CABG) X3  ON PUMP USING LEFT INTERNAL MAMMARY ARTERY AND RIGHT GREATER SAPHENOUS ENDOSCOPICALLY HARVESTED VEIN GRAFTS (N/A Chest) TRANSESOPHAGEAL ECHOCARDIOGRAM (TEE) (N/A )  Patient Location: SICU  Anesthesia Type:General  Level of Consciousness: Patient remains intubated per anesthesia plan  Airway & Oxygen Therapy: Patient placed on Ventilator (see vital sign flow sheet for setting)  Post-op Assessment: Report given to RN and Post -op Vital signs reviewed and stable  Post vital signs: Reviewed and stable  Last Vitals:  Vitals Value Taken Time  BP    Temp    Pulse 86 July 04, 2019 1336  Resp 0 2019-07-04 1336  SpO2 96 % 04-Jul-2019 1336  Vitals shown include unvalidated device data.  Last Pain:  Vitals:   2019-07-04 0454  TempSrc: Oral  PainSc:       Patients Stated Pain Goal: 0 (04/14/29 0762)  Complications: No apparent anesthesia complications

## 2019-06-16 NOTE — Anesthesia Preprocedure Evaluation (Addendum)
Anesthesia Evaluation  Patient identified by MRN, date of birth, ID band Patient awake    Reviewed: Allergy & Precautions, NPO status , Patient's Chart, lab work & pertinent test results  History of Anesthesia Complications Negative for: history of anesthetic complications  Airway Mallampati: I  TM Distance: >3 FB Neck ROM: full    Dental  (+) Chipped, Dental Advidsory Given   Pulmonary shortness of breath and lying, former smoker,    breath sounds clear to auscultation       Cardiovascular + CAD, + Past MI and +CHF   Rhythm:regular Rate:Normal     Neuro/Psych    GI/Hepatic   Endo/Other  Morbid obesity  Renal/GU      Musculoskeletal   Abdominal   Peds  Hematology   Anesthesia Other Findings   Reproductive/Obstetrics                            Anesthesia Physical Anesthesia Plan  ASA: IV  Anesthesia Plan: General   Post-op Pain Management:    Induction: Intravenous  PONV Risk Score and Plan: 3 and Treatment may vary due to age or medical condition  Airway Management Planned: Oral ETT  Additional Equipment: Arterial line, CVP, PA Cath, TEE and Ultrasound Guidance Line Placement  Intra-op Plan:   Post-operative Plan: Post-operative intubation/ventilation  Informed Consent: I have reviewed the patients History and Physical, chart, labs and discussed the procedure including the risks, benefits and alternatives for the proposed anesthesia with the patient or authorized representative who has indicated his/her understanding and acceptance.     Dental Advisory Given  Plan Discussed with: CRNA and Surgeon  Anesthesia Plan Comments:        Anesthesia Quick Evaluation

## 2019-06-17 ENCOUNTER — Encounter (HOSPITAL_COMMUNITY): Payer: Self-pay | Admitting: Surgery

## 2019-06-17 ENCOUNTER — Inpatient Hospital Stay (HOSPITAL_COMMUNITY): Payer: Medicare Other

## 2019-06-17 LAB — POCT I-STAT 7, (LYTES, BLD GAS, ICA,H+H)
Bicarbonate: 26.4 mmol/L (ref 20.0–28.0)
Calcium, Ion: 1.19 mmol/L (ref 1.15–1.40)
HCT: 33 % — ABNORMAL LOW (ref 36.0–46.0)
Hemoglobin: 11.2 g/dL — ABNORMAL LOW (ref 12.0–15.0)
O2 Saturation: 95 %
Patient temperature: 36.8
Potassium: 3.6 mmol/L (ref 3.5–5.1)
Sodium: 141 mmol/L (ref 135–145)
TCO2: 28 mmol/L (ref 22–32)
pCO2 arterial: 46.7 mmHg (ref 32.0–48.0)
pH, Arterial: 7.359 (ref 7.350–7.450)
pO2, Arterial: 79 mmHg — ABNORMAL LOW (ref 83.0–108.0)

## 2019-06-17 LAB — GLUCOSE, CAPILLARY
Glucose-Capillary: 104 mg/dL — ABNORMAL HIGH (ref 70–99)
Glucose-Capillary: 133 mg/dL — ABNORMAL HIGH (ref 70–99)
Glucose-Capillary: 135 mg/dL — ABNORMAL HIGH (ref 70–99)
Glucose-Capillary: 144 mg/dL — ABNORMAL HIGH (ref 70–99)
Glucose-Capillary: 144 mg/dL — ABNORMAL HIGH (ref 70–99)
Glucose-Capillary: 146 mg/dL — ABNORMAL HIGH (ref 70–99)
Glucose-Capillary: 147 mg/dL — ABNORMAL HIGH (ref 70–99)
Glucose-Capillary: 150 mg/dL — ABNORMAL HIGH (ref 70–99)
Glucose-Capillary: 152 mg/dL — ABNORMAL HIGH (ref 70–99)
Glucose-Capillary: 152 mg/dL — ABNORMAL HIGH (ref 70–99)
Glucose-Capillary: 170 mg/dL — ABNORMAL HIGH (ref 70–99)
Glucose-Capillary: 180 mg/dL — ABNORMAL HIGH (ref 70–99)
Glucose-Capillary: 184 mg/dL — ABNORMAL HIGH (ref 70–99)

## 2019-06-17 LAB — BASIC METABOLIC PANEL
Anion gap: 7 (ref 5–15)
Anion gap: 9 (ref 5–15)
BUN: 13 mg/dL (ref 8–23)
BUN: 13 mg/dL (ref 8–23)
CO2: 27 mmol/L (ref 22–32)
CO2: 27 mmol/L (ref 22–32)
Calcium: 8.5 mg/dL — ABNORMAL LOW (ref 8.9–10.3)
Calcium: 8.8 mg/dL — ABNORMAL LOW (ref 8.9–10.3)
Chloride: 106 mmol/L (ref 98–111)
Chloride: 103 mmol/L (ref 98–111)
Creatinine, Ser: 0.72 mg/dL (ref 0.44–1.00)
Creatinine, Ser: 0.97 mg/dL (ref 0.44–1.00)
GFR calc Af Amer: >60 mL/min (ref >60)
GFR calc Af Amer: >60 mL/min (ref >60)
GFR calc non Af Amer: >60 mL/min (ref >60)
GFR calc non Af Amer: >60 mL/min (ref >60)
Glucose, Bld: 141 mg/dL — ABNORMAL HIGH (ref 70–99)
Glucose, Bld: 206 mg/dL — ABNORMAL HIGH (ref 70–99)
Potassium: 3.5 mmol/L (ref 3.5–5.1)
Potassium: 3.5 mmol/L (ref 3.5–5.1)
Sodium: 140 mmol/L (ref 135–145)
Sodium: 139 mmol/L (ref 135–145)

## 2019-06-17 LAB — CBC
HCT: 32.6 % — ABNORMAL LOW (ref 36.0–46.0)
HCT: 33.8 % — ABNORMAL LOW (ref 36.0–46.0)
Hemoglobin: 10.5 g/dL — ABNORMAL LOW (ref 12.0–15.0)
Hemoglobin: 10.8 g/dL — ABNORMAL LOW (ref 12.0–15.0)
MCH: 30.3 pg (ref 26.0–34.0)
MCH: 30.3 pg (ref 26.0–34.0)
MCHC: 32.2 g/dL (ref 30.0–36.0)
MCHC: 32 g/dL (ref 30.0–36.0)
MCV: 93.9 fL (ref 80.0–100.0)
MCV: 94.9 fL (ref 80.0–100.0)
Platelets: 212 10*3/uL (ref 150–400)
Platelets: 187 10*3/uL (ref 150–400)
RBC: 3.47 MIL/uL — ABNORMAL LOW (ref 3.87–5.11)
RBC: 3.56 MIL/uL — ABNORMAL LOW (ref 3.87–5.11)
RDW: 12.8 % (ref 11.5–15.5)
RDW: 12.9 % (ref 11.5–15.5)
WBC: 14.9 10*3/uL — ABNORMAL HIGH (ref 4.0–10.5)
WBC: 15.2 10*3/uL — ABNORMAL HIGH (ref 4.0–10.5)
nRBC: 0 % (ref 0.0–0.2)
nRBC: 0 % (ref 0.0–0.2)

## 2019-06-17 LAB — MAGNESIUM
Magnesium: 2.4 mg/dL (ref 1.7–2.4)
Magnesium: 2.1 mg/dL (ref 1.7–2.4)

## 2019-06-17 MED ORDER — INSULIN DETEMIR 100 UNIT/ML ~~LOC~~ SOLN
20.0000 [IU] | Freq: Every day | SUBCUTANEOUS | Status: DC
  Administered 2019-06-17 – 2019-06-18 (×2): 20 [IU] via SUBCUTANEOUS
  Filled 2019-06-17 (×2): qty 0.2

## 2019-06-17 MED ORDER — POTASSIUM CHLORIDE 10 MEQ/50ML IV SOLN
10.0000 meq | INTRAVENOUS | Status: AC
  Administered 2019-06-17 (×2): 10 meq via INTRAVENOUS
  Filled 2019-06-17 (×3): qty 50

## 2019-06-17 MED ORDER — INSULIN DETEMIR 100 UNIT/ML ~~LOC~~ SOLN: 20.0000 [IU] | Freq: Every day | SUBCUTANEOUS | Status: DC

## 2019-06-17 MED ORDER — INSULIN ASPART 100 UNIT/ML ~~LOC~~ SOLN
0.0000 [IU] | Freq: Three times a day (TID) | SUBCUTANEOUS | Status: DC
  Administered 2019-06-17: 2 [IU] via SUBCUTANEOUS
  Administered 2019-06-18: 4 [IU] via SUBCUTANEOUS
  Administered 2019-06-18 (×2): 8 [IU] via SUBCUTANEOUS

## 2019-06-17 MED ORDER — TRAMADOL HCL 50 MG PO TABS: 50.0000 mg | ORAL_TABLET | ORAL | Status: DC | PRN

## 2019-06-17 MED ORDER — FUROSEMIDE 10 MG/ML IJ SOLN
40.0000 mg | Freq: Two times a day (BID) | INTRAMUSCULAR | Status: AC
  Administered 2019-06-17 (×2): 40 mg via INTRAVENOUS
  Filled 2019-06-17 (×2): qty 4

## 2019-06-17 MED ORDER — ENOXAPARIN SODIUM 40 MG/0.4ML ~~LOC~~ SOLN
40.0000 mg | Freq: Every day | SUBCUTANEOUS | Status: DC
  Administered 2019-06-17: 40 mg via SUBCUTANEOUS
  Filled 2019-06-17: qty 0.4

## 2019-06-17 MED ORDER — INSULIN ASPART 100 UNIT/ML ~~LOC~~ SOLN: 0.0000 [IU] | SUBCUTANEOUS | Status: DC

## 2019-06-17 MED ORDER — POTASSIUM CHLORIDE CRYS ER 20 MEQ PO TBCR
20.0000 meq | EXTENDED_RELEASE_TABLET | Freq: Three times a day (TID) | ORAL | Status: AC
  Administered 2019-06-17 (×3): 20 meq via ORAL
  Filled 2019-06-17 (×3): qty 1

## 2019-06-17 MED FILL — Thrombin (Recombinant) For Soln 20000 Unit: CUTANEOUS | Qty: 1 | Status: AC

## 2019-06-17 NOTE — Plan of Care (Deleted)
Progressing other than respiratory and having some low blood pressure. On midodrine to wean off neosynephrine.  Encouraging IS frequently, patient has been up in the chair all day having increased respirations, with increased o2 demand.  At 5 LPM. May need bipap for atalectasis.    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   Instruct and encourage IS Q 1-2 hours encourage uip in chair, ambulation when able  Encourage coughing and pillow hold/splinting  O2 to titrate o2 92 or greater.

## 2019-06-17 NOTE — Progress Notes (Deleted)
Insulin coverage held at 1200 because patient  States she will not eat lunch.

## 2019-06-17 NOTE — Progress Notes (Signed)
Arterial line malpositioned, poor waveform.  Drew back and got some blood, better waveform, but soon dampened again. Off neo, d.c arterial line, pressure held to achieve hemostasis.  Dressing applied. No bleeding from site. Patient instructed on  Letting me know if she notices any issue with site.

## 2019-06-17 NOTE — Progress Notes (Signed)
1 Day Post-Op Procedure(s) (LRB): CORONARY ARTERY BYPASS GRAFTING (CABG) X3  ON PUMP USING LEFT INTERNAL MAMMARY ARTERY AND RIGHT GREATER SAPHENOUS ENDOSCOPICALLY HARVESTED VEIN GRAFTS (N/A) TRANSESOPHAGEAL ECHOCARDIOGRAM (TEE) (N/A) Subjective: Says she feels great. Breathing is much better than preop. Having clear liquid breakfast.  Objective: Vital signs in last 24 hours: Temp:  [96.8 F (36 C)-98.6 F (37 C)] 98.2 F (36.8 C) (11/25 0615) Pulse Rate:  [71-91] 72 (11/25 0615) Cardiac Rhythm: Normal sinus rhythm (11/25 0400) Resp:  [12-32] 20 (11/25 0615) BP: (88-110)/(42-70) 102/49 (11/25 0500) SpO2:  [88 %-99 %] 99 % (11/25 0615) Arterial Line BP: (91-126)/(41-63) 125/51 (11/25 0615) FiO2 (%):  [40 %-100 %] 40 % (11/24 1824) Weight:  [113.5 kg] 113.5 kg (11/25 0600)  Hemodynamic parameters for last 24 hours: PAP: (21-43)/(9-23) 33/17 CO:  [5.1 L/min-6 L/min] 5.7 L/min CI:  [2.4 L/min/m2-2.8 L/min/m2] 2.6 L/min/m2  Intake/Output from previous day: 11/24 0701 - 11/25 0700 In: 6677.2 [I.V.:4455; Blood:250; IV Piggyback:1972.2] Out: 3835 [Urine:2525; Blood:700; Chest Tube:610] Intake/Output this shift: No intake/output data recorded.  General appearance: alert and cooperative Neurologic: intact Heart: regular rate and rhythm, S1, S2 normal, no murmur, click, rub or gallop Lungs: few rales bilat Extremities: edema mild Wound: dressings dry  Lab Results: Recent Labs    05/25/2019 2006 06/01/2019 2122 06/17/19 0402  WBC 19.0*  --  14.9*  HGB 11.6* 11.2* 10.5*  HCT 35.0* 33.0* 32.6*  PLT 246  --  212   BMET:  Recent Labs    06/01/2019 0346  06/07/2019 1203  06/19/2019 2006 06/05/2019 2122 06/17/19 0402  NA 140   < > 141   < >  --  141 140  K 3.3*   < > 3.8   < >  --  3.6 3.5  CL 102   < > 104  --   --   --  106  CO2 24  --   --   --   --   --  27  GLUCOSE 142*   < > 206*  --   --   --  141*  BUN 20   < > 19  --   --   --  13  CREATININE 0.96   < > 0.70  --  0.76  --   0.72  CALCIUM 9.4  --   --   --   --   --  8.5*   < > = values in this interval not displayed.    PT/INR:  Recent Labs    05/29/2019 1304  LABPROT 16.4*  INR 1.3*   ABG    Component Value Date/Time   PHART 7.359 06/09/2019 2122   HCO3 26.4 06/10/2019 2122   TCO2 28 06/14/2019 2122   ACIDBASEDEF 1.0 06/21/2019 1848   O2SAT 95.0 06/17/2019 2122   CBG (last 3)  Recent Labs    06/17/19 0505 06/17/19 0616 06/17/19 0716  GLUCAP 147* 133* 152*   CXR: bilateral interstitial edema and atelectasis.   ECG: sinus, no acute changes  Assessment/Plan: S/P Procedure(s) (LRB): CORONARY ARTERY BYPASS GRAFTING (CABG) X3  ON PUMP USING LEFT INTERNAL MAMMARY ARTERY AND RIGHT GREATER SAPHENOUS ENDOSCOPICALLY HARVESTED VEIN GRAFTS (N/A) TRANSESOPHAGEAL ECHOCARDIOGRAM (TEE) (N/A)  POD 1 Hemodynamically stable with CI 2.6 on milrinone 0.25 and dop 3. Will continue these today with reduced EF 30-35% and pulmonary edema preop. This may aid with diuresis. Wean neo as tolerated. Hold off on beta blocker while on vasopressors. Can DC swan.  Volume excess with preop acute systolic congestive heart failure with pulmonary edema. Start diuresis. Continue inotropes as above.  IS, OOB.  DC MT's today. Continue pleural tubes with preop pleural effusions.  DM: start Levemir and SSI.     LOS: 4 days    Gaye Pollack 06/17/2019

## 2019-06-17 NOTE — Progress Notes (Signed)
Patient ID: Nicole Moreno, female   DOB: 28-Jun-1951, 68 y.o.   MRN: [id number] TCTS Evening Rounds:  Hemodynamically stable in sinus rhythm on milrinone 0.25 and dop 3.  Diuresing well  CBC    Component Value Date/Time   WBC 15.2 (H) 06/17/2019 1822   RBC 3.56 (L) 06/17/2019 1822   HGB 10.8 (L) 06/17/2019 1822   HCT 33.8 (L) 06/17/2019 1822   PLT 187 06/17/2019 1822   MCV 94.9 06/17/2019 1822   MCH 30.3 06/17/2019 1822   MCHC 32.0 06/17/2019 1822   RDW 12.9 06/17/2019 1822   LYMPHSABS 1.9 05/29/2019 1507   MONOABS 0.8 06/10/2019 1507   EOSABS 0.1 06/01/2019 1507   BASOSABS 0.1 05/26/2019 1507   BMET pending

## 2019-06-17 NOTE — Discharge Instructions (Signed)
Coronary Artery Bypass Grafting, Care After This sheet gives you information about how to care for yourself after your procedure. Your doctor may also give you more specific instructions. If you have problems or questions, call your doctor. What can I expect after the procedure? After the procedure, it is common to:  Feel sick to your stomach (nauseous).  Not want to eat as much as normal (lack of appetite).  Have trouble pooping (constipation).  Have weakness and tiredness (fatigue).  Feel sad (depressed) or grouchy (irritable).  Have pain or discomfort around the cuts from surgery (incisions). Follow these instructions at home: Medicines  Take over-the-counter and prescription medicines only as told by your doctor. Do not stop taking medicines or start any new medicines unless your doctor says it is okay.  If you were prescribed an antibiotic medicine, take it as told by your doctor. Do not stop taking the antibiotic even if you start to feel better. Incision care   Follow instructions from your doctor about how to take care of your cuts from surgery. Make sure you: ? Wash your hands with soap and water before and after you change your bandage (dressing). If you cannot use soap and water, use hand sanitizer. ? Change your bandage as told by your doctor. ? Leave stitches (sutures), skin glue, or skin tape (adhesive) strips in place. They may need to stay in place for 2 weeks or longer. If tape strips get loose and curl up, you may trim the loose edges. Do not remove tape strips completely unless your doctor says it is okay.  Make sure the surgery cuts are clean, dry, and protected.  Check your cut areas every day for signs of infection. Check for: ? More redness, swelling, or pain. ? More fluid or blood. ? Warmth. ? Pus or a bad smell.  If cuts were made in your legs: ? Avoid crossing your legs. ? Avoid sitting for long periods of time. Change positions every 30  minutes. ? Raise (elevate) your legs when you are sitting. Bathing  Do not take baths, swim, or use a hot tub until your doctor says it is okay.  You may shower.  Pat the surgery cuts dry. Do not rub the cuts to dry.  Endoscopic Saphenous Vein Harvesting, Care After This sheet gives you information about how to care for yourself after your procedure. Your health care provider may also give you more specific instructions. If you have problems or questions, contact your health care provider. What can I expect after the procedure? After the procedure, it is common to have:  Pain.  Bruising.  Swelling.  Numbness. Follow these instructions at home: Incision care   Follow instructions from your health care provider about how to take care of your incisions. Make sure you: ? Wash your hands with soap and water before and after you change your bandages (dressings). If soap and water are not available, use hand sanitizer. ? Change your dressings as told by your health care provider. ? Leave stitches (sutures), skin glue, or adhesive strips in place. These skin closures may need to stay in place for 2 weeks or longer. If adhesive strip edges start to loosen and curl up, you may trim the loose edges. Do not remove adhesive strips completely unless your health care provider tells you to do that.  Check your incision areas every day for signs of infection. Check for: ? More redness, swelling, or pain. ? Fluid or blood. ? Warmth. ?  Pus or a bad smell. Medicines  Take over-the-counter and prescription medicines only as told by your health care provider.  Ask your health care provider if the medicine prescribed to you requires you to avoid driving or using heavy machinery. General instructions  Raise (elevate) your legs above the level of your heart while you are sitting or lying down.  Avoid crossing your legs.  Avoid sitting for long periods of time. Change positions every 30  minutes.  Do any exercises your health care providers have given you. These may include deep breathing, coughing, and walking exercises.  Do not take baths, swim, or use a hot tub until your health care provider approves. Ask your health care provider if you may take showers. You may only be allowed to take sponge baths.  Wear compression stockings as told by your health care provider. These stockings help to prevent blood clots and reduce swelling in your legs.  Keep all follow-up visits as told by your health care provider. This is important. Contact a health care provider if:  Medicine does not help your pain.  Your pain gets worse.  You have new leg bruises or your leg bruises get bigger.  Your leg feels numb.  You have more redness, swelling, or pain around your incision.  You have fluid or blood coming from your incision.  Your incision feels warm to the touch.  You have pus or a bad smell coming from your incision.  You have a fever. Get help right away if:  Your pain is severe.  You develop pain, tenderness, warmth, redness, or swelling in any part of your leg.  You have chest pain.  You have trouble breathing. Summary  Raise (elevate) your legs above the level of your heart while you are sitting or lying down.  Wear compression stockings as told by your health care provider.  Make sure you know which symptoms should prompt you to contact your health care provider.  Keep all follow-up visits as told by your health care provider. This information is not intended to replace advice given to you by your health care provider. Make sure you discuss any questions you have with your health care provider. Document Released: 03/21/2011 Document Revised: 06/16/2018 Document Reviewed: 06/16/2018 Elsevier Patient Education  2020 [employer]  Eating and drinking   Eat foods that are high in fiber, such as beans, nuts, whole grains, and raw fruits and vegetables. Any  meats you eat should be lean cut. Avoid canned, processed, and fried foods. This can help prevent trouble pooping. This is also a part of a heart-healthy diet.  Drink enough fluid to keep your pee (urine) pale yellow.  Do not drink alcohol until you are fully recovered. Ask your doctor when it is safe to drink alcohol. Activity  Rest and limit your activity as told by your doctor. You may be told to: ? Stop any activity right away if you have chest pain, shortness of breath, irregular heartbeats, or dizziness. Get help right away if you have any of these symptoms. ? Move around often for short periods or take short walks as told by your doctor. Slowly increase your activities. ? Avoid lifting, pushing, or pulling anything that is heavier than 10 lb (4.5 kg) for at least 6 weeks or as told by your doctor.  Do physical therapy or a cardiac rehab (cardiac rehabilitation) program as told by your doctor. ? Physical therapy involves doing exercises to maintain movement and build strength and  endurance. ? A cardiac rehab program includes:  Exercise training.  Education.  Counseling.  Do not drive until your doctor says it is okay.  Ask your doctor when you can go back to work.  Ask your doctor when you can be sexually active. General instructions  Do not drive or use heavy machinery while taking prescription pain medicine.  Do not use any products that contain nicotine or tobacco. These include cigarettes, e-cigarettes, and chewing tobacco. If you need help quitting, ask your doctor.  Take 2-3 deep breaths every few hours during the day while you get better. This helps expand your lungs and prevent problems.  If you were given a device called an incentive spirometer, use it several times a day to practice deep breathing. Support your chest with a pillow or your arms when you take deep breaths or cough.  Wear compression stockings as told by your doctor.  Weigh yourself every day. This  helps to see if your body is holding (retaining) fluid that may make your heart and lungs work harder.  Keep all follow-up visits as told by your doctor. This is important. Contact a doctor if:  You have more redness, swelling, or pain around any cut.  You have more fluid or blood coming from any cut.  Any cut feels warm to the touch.  You have pus or a bad smell coming from any cut.  You have a fever.  You have swelling in your ankles or legs.  You have pain in your legs.  You gain 2 lb (0.9 kg) or more a day.  You feel sick to your stomach or you throw up (vomit).  You have watery poop (diarrhea). Get help right away if:  You have chest pain that goes to your jaw or arms.  You are short of breath.  You have a fast or irregular heartbeat.  You notice a "clicking" in your breastbone (sternum) when you move.  You have any signs of a stroke. "BE FAST" is an easy way to remember the main warning signs: ? B - Balance. Signs are dizziness, sudden trouble walking, or loss of balance. ? E - Eyes. Signs are trouble seeing or a change in how you see. ? F - Face. Signs are sudden weakness or loss of feeling of the face, or the face or eyelid drooping on one side. ? A - Arms. Signs are weakness or loss of feeling in an arm. This happens suddenly and usually on one side of the body. ? S - Speech. Signs are sudden trouble speaking, slurred speech, or trouble understanding what people say. ? T - Time. Time to call emergency services. Write down what time symptoms started.  You have other signs of a stroke, such as: ? A sudden, very bad headache with no known cause. ? Feeling sick to your stomach. ? Throwing up. ? Jerky movements you cannot control (seizure). These symptoms may be an emergency. Do not wait to see if the symptoms will go away. Get medical help right away. Call your local emergency services (911 in the [place] Do not drive yourself to the hospital. Summary  After the  procedure, it is common to have pain or discomfort in the cuts from surgery (incisions).  Do not take baths, swim, or use a hot tub until your doctor says it is okay.  Slowly increase your activities. You may need physical therapy or cardiac rehab.  Weigh yourself every day. This helps to see if your  body is holding fluid. This information is not intended to replace advice given to you by your health care provider. Make sure you discuss any questions you have with your health care provider. Document Released: 07/14/2013 Document Revised: 03/18/2018 Document Reviewed: 03/18/2018 Elsevier Patient Education  2020 [employer].

## 2019-06-18 ENCOUNTER — Inpatient Hospital Stay (HOSPITAL_COMMUNITY): Admission: EM | Disposition: E | Payer: Self-pay | Source: Home / Self Care | Attending: Surgery

## 2019-06-18 ENCOUNTER — Inpatient Hospital Stay (HOSPITAL_COMMUNITY): Payer: Medicare Other

## 2019-06-18 DIAGNOSIS — I472 Ventricular tachycardia, unspecified: Secondary | ICD-10-CM

## 2019-06-18 DIAGNOSIS — I469 Cardiac arrest, cause unspecified: Secondary | ICD-10-CM

## 2019-06-18 DIAGNOSIS — J9601 Acute respiratory failure with hypoxia: Secondary | ICD-10-CM

## 2019-06-18 DIAGNOSIS — I4901 Ventricular fibrillation: Secondary | ICD-10-CM

## 2019-06-18 DIAGNOSIS — R570 Cardiogenic shock: Secondary | ICD-10-CM

## 2019-06-18 DIAGNOSIS — I2510 Atherosclerotic heart disease of native coronary artery without angina pectoris: Secondary | ICD-10-CM

## 2019-06-18 HISTORY — PX: CORONARY/GRAFT ACUTE MI REVASCULARIZATION: CATH118305

## 2019-06-18 HISTORY — PX: LEFT HEART CATH AND CORONARY ANGIOGRAPHY: CATH118249

## 2019-06-18 HISTORY — PX: TEMPORARY PACEMAKER: CATH118268

## 2019-06-18 HISTORY — PX: RIGHT HEART CATH: CATH118263

## 2019-06-18 HISTORY — PX: IABP INSERTION: CATH118242

## 2019-06-18 LAB — POCT I-STAT 7, (LYTES, BLD GAS, ICA,H+H)
Acid-Base Excess: 5 mmol/L — ABNORMAL HIGH (ref 0.0–2.0)
Acid-base deficit: 2 mmol/L (ref 0.0–2.0)
Acid-base deficit: 3 mmol/L — ABNORMAL HIGH (ref 0.0–2.0)
Bicarbonate: 28.3 mmol/L — ABNORMAL HIGH (ref 20.0–28.0)
Bicarbonate: 26.1 mmol/L (ref 20.0–28.0)
Bicarbonate: 27.2 mmol/L (ref 20.0–28.0)
Bicarbonate: 34 mmol/L — ABNORMAL HIGH (ref 20.0–28.0)
Calcium, Ion: 1.37 mmol/L (ref 1.15–1.40)
Calcium, Ion: 1.12 mmol/L — ABNORMAL LOW (ref 1.15–1.40)
Calcium, Ion: 1.23 mmol/L (ref 1.15–1.40)
Calcium, Ion: 1.27 mmol/L (ref 1.15–1.40)
HCT: 26 % — ABNORMAL LOW (ref 36.0–46.0)
HCT: 28 % — ABNORMAL LOW (ref 36.0–46.0)
HCT: 29 % — ABNORMAL LOW (ref 36.0–46.0)
HCT: 30 % — ABNORMAL LOW (ref 36.0–46.0)
Hemoglobin: 8.8 g/dL — ABNORMAL LOW (ref 12.0–15.0)
Hemoglobin: 10.2 g/dL — ABNORMAL LOW (ref 12.0–15.0)
Hemoglobin: 9.5 g/dL — ABNORMAL LOW (ref 12.0–15.0)
Hemoglobin: 9.9 g/dL — ABNORMAL LOW (ref 12.0–15.0)
O2 Saturation: 81 %
O2 Saturation: 34 %
O2 Saturation: 83 %
O2 Saturation: 87 %
Patient temperature: 97.8
Potassium: 3.4 mmol/L — ABNORMAL LOW (ref 3.5–5.1)
Potassium: 3.5 mmol/L (ref 3.5–5.1)
Potassium: 3.5 mmol/L (ref 3.5–5.1)
Potassium: 3.5 mmol/L (ref 3.5–5.1)
Sodium: 139 mmol/L (ref 135–145)
Sodium: 135 mmol/L (ref 135–145)
Sodium: 137 mmol/L (ref 135–145)
Sodium: 140 mmol/L (ref 135–145)
TCO2: 30 mmol/L (ref 22–32)
TCO2: 28 mmol/L (ref 22–32)
TCO2: 29 mmol/L (ref 22–32)
TCO2: 36 mmol/L — ABNORMAL HIGH (ref 22–32)
pCO2 arterial: 67.7 mmHg (ref 32.0–48.0)
pCO2 arterial: 67.8 mmHg (ref 32.0–48.0)
pCO2 arterial: 69.3 mmHg (ref 32.0–48.0)
pCO2 arterial: 79.4 mmHg (ref 32.0–48.0)
pH, Arterial: 7.226 — ABNORMAL LOW (ref 7.350–7.450)
pH, Arterial: 7.193 — CL (ref 7.350–7.450)
pH, Arterial: 7.202 — ABNORMAL LOW (ref 7.350–7.450)
pH, Arterial: 7.239 — ABNORMAL LOW (ref 7.350–7.450)
pO2, Arterial: 54 mmHg — ABNORMAL LOW (ref 83.0–108.0)
pO2, Arterial: 25 mmHg — CL (ref 83.0–108.0)
pO2, Arterial: 60 mmHg — ABNORMAL LOW (ref 83.0–108.0)
pO2, Arterial: 66 mmHg — ABNORMAL LOW (ref 83.0–108.0)

## 2019-06-18 LAB — CBC
HCT: 33 % — ABNORMAL LOW (ref 36.0–46.0)
Hemoglobin: 10.8 g/dL — ABNORMAL LOW (ref 12.0–15.0)
MCH: 30.1 pg (ref 26.0–34.0)
MCHC: 32.7 g/dL (ref 30.0–36.0)
MCV: 91.9 fL (ref 80.0–100.0)
Platelets: 187 10*3/uL (ref 150–400)
RBC: 3.59 MIL/uL — ABNORMAL LOW (ref 3.87–5.11)
RDW: 12.9 % (ref 11.5–15.5)
WBC: 15.2 10*3/uL — ABNORMAL HIGH (ref 4.0–10.5)
nRBC: 0 % (ref 0.0–0.2)

## 2019-06-18 LAB — BASIC METABOLIC PANEL
Anion gap: 10 (ref 5–15)
Anion gap: 9 (ref 5–15)
BUN: 12 mg/dL (ref 8–23)
BUN: 11 mg/dL (ref 8–23)
CO2: 28 mmol/L (ref 22–32)
CO2: 31 mmol/L (ref 22–32)
Calcium: 9.1 mg/dL (ref 8.9–10.3)
Calcium: 9.3 mg/dL (ref 8.9–10.3)
Chloride: 102 mmol/L (ref 98–111)
Chloride: 98 mmol/L (ref 98–111)
Creatinine, Ser: 1.03 mg/dL — ABNORMAL HIGH (ref 0.44–1.00)
Creatinine, Ser: 0.84 mg/dL (ref 0.44–1.00)
GFR calc Af Amer: >60 mL/min (ref >60)
GFR calc Af Amer: >60 mL/min (ref >60)
GFR calc non Af Amer: 56 mL/min — ABNORMAL LOW (ref >60)
GFR calc non Af Amer: >60 mL/min (ref >60)
Glucose, Bld: 176 mg/dL — ABNORMAL HIGH (ref 70–99)
Glucose, Bld: 189 mg/dL — ABNORMAL HIGH (ref 70–99)
Potassium: 4.1 mmol/L (ref 3.5–5.1)
Potassium: 3.5 mmol/L (ref 3.5–5.1)
Sodium: 140 mmol/L (ref 135–145)
Sodium: 138 mmol/L (ref 135–145)

## 2019-06-18 LAB — POCT I-STAT EG7
Acid-Base Excess: 5 mmol/L — ABNORMAL HIGH (ref 0.0–2.0)
Bicarbonate: 34.5 mmol/L — ABNORMAL HIGH (ref 20.0–28.0)
Calcium, Ion: 1.19 mmol/L (ref 1.15–1.40)
HCT: 30 % — ABNORMAL LOW (ref 36.0–46.0)
Hemoglobin: 10.2 g/dL — ABNORMAL LOW (ref 12.0–15.0)
O2 Saturation: 35 %
Potassium: 3.6 mmol/L (ref 3.5–5.1)
Sodium: 139 mmol/L (ref 135–145)
TCO2: 37 mmol/L — ABNORMAL HIGH (ref 22–32)
pCO2, Ven: 80.9 mmHg (ref 44.0–60.0)
pH, Ven: 7.238 — ABNORMAL LOW (ref 7.250–7.430)
pO2, Ven: 26 mmHg — CL (ref 32.0–45.0)

## 2019-06-18 LAB — GLUCOSE, CAPILLARY
Glucose-Capillary: 220 mg/dL — ABNORMAL HIGH (ref 70–99)
Glucose-Capillary: 181 mg/dL — ABNORMAL HIGH (ref 70–99)
Glucose-Capillary: 205 mg/dL — ABNORMAL HIGH (ref 70–99)
Glucose-Capillary: 482 mg/dL — ABNORMAL HIGH (ref 70–99)

## 2019-06-18 LAB — COOXEMETRY PANEL
Carboxyhemoglobin: 0.9 % (ref 0.5–1.5)
Methemoglobin: 0.8 % (ref 0.0–1.5)
O2 Saturation: 66.7 %
Total hemoglobin: 11.4 g/dL — ABNORMAL LOW (ref 12.0–16.0)

## 2019-06-18 LAB — MAGNESIUM: Magnesium: 2 mg/dL (ref 1.7–2.4)

## 2019-06-18 SURGERY — CORONARY/GRAFT ACUTE MI REVASCULARIZATION
Anesthesia: LOCAL

## 2019-06-18 MED ORDER — FENTANYL CITRATE (PF) 100 MCG/2ML IJ SOLN: 50.0000 ug | Freq: Once | INTRAMUSCULAR | Status: DC

## 2019-06-18 MED ORDER — AMIODARONE HCL 150 MG/3ML IV SOLN
INTRAVENOUS | Status: AC
  Filled 2019-06-18: qty 3

## 2019-06-18 MED ORDER — AMIODARONE HCL IN DEXTROSE 360-4.14 MG/200ML-% IV SOLN
60.0000 mg/h | INTRAVENOUS | Status: DC
  Administered 2019-06-18: 60 mg/h via INTRAVENOUS

## 2019-06-18 MED ORDER — SODIUM BICARBONATE 8.4 % IV SOLN
INTRAVENOUS | Status: AC
  Filled 2019-06-18: qty 50

## 2019-06-18 MED ORDER — LIDOCAINE IN D5W 4-5 MG/ML-% IV SOLN: 1.0000 mg/min | INTRAVENOUS | Status: DC

## 2019-06-18 MED ORDER — FUROSEMIDE 10 MG/ML IJ SOLN
40.0000 mg | Freq: Two times a day (BID) | INTRAMUSCULAR | Status: AC
  Administered 2019-06-18 (×2): 40 mg via INTRAVENOUS
  Filled 2019-06-18 (×2): qty 4

## 2019-06-18 MED ORDER — LIDOCAINE HCL (CARDIAC) PF 100 MG/5ML IV SOSY
PREFILLED_SYRINGE | INTRAVENOUS | Status: AC
  Filled 2019-06-18: qty 5

## 2019-06-18 MED ORDER — NITROGLYCERIN 1 MG/10 ML FOR IR/CATH LAB
INTRA_ARTERIAL | Status: AC
  Filled 2019-06-18: qty 10

## 2019-06-18 MED ORDER — EPINEPHRINE 1 MG/10ML IJ SOSY
PREFILLED_SYRINGE | INTRAMUSCULAR | Status: AC
  Filled 2019-06-18: qty 40

## 2019-06-18 MED ORDER — SODIUM BICARBONATE 8.4 % IV SOLN
INTRAVENOUS | Status: DC | PRN
  Administered 2019-06-18 (×2): 50 meq via INTRAVENOUS

## 2019-06-18 MED ORDER — IOHEXOL 350 MG/ML SOLN
INTRAVENOUS | Status: DC | PRN
  Administered 2019-06-18: 100 mL

## 2019-06-18 MED ORDER — FENTANYL 2500MCG IN NS 250ML (10MCG/ML) PREMIX INFUSION: 50.0000 ug/h | INTRAVENOUS | Status: DC

## 2019-06-18 MED ORDER — AMIODARONE HCL IN DEXTROSE 360-4.14 MG/200ML-% IV SOLN
INTRAVENOUS | Status: AC
  Filled 2019-06-18: qty 200

## 2019-06-18 MED ORDER — EPINEPHRINE 1 MG/10ML IJ SOSY
PREFILLED_SYRINGE | INTRAMUSCULAR | Status: DC | PRN
  Administered 2019-06-18 (×2): 1 via INTRAVENOUS

## 2019-06-18 MED ORDER — HEPARIN (PORCINE) IN NACL 1000-0.9 UT/500ML-% IV SOLN
INTRAVENOUS | Status: AC
  Filled 2019-06-18: qty 1000

## 2019-06-18 MED ORDER — SODIUM CHLORIDE 0.9 % IV SOLN
INTRAVENOUS | Status: AC | PRN
  Administered 2019-06-18: 10 mL/h via INTRAVENOUS

## 2019-06-18 MED ORDER — FENTANYL BOLUS VIA INFUSION
50.0000 ug | INTRAVENOUS | Status: DC | PRN
  Filled 2019-06-18: qty 50

## 2019-06-18 MED ORDER — PANTOPRAZOLE SODIUM 40 MG IV SOLR: 40.0000 mg | INTRAVENOUS | Status: DC

## 2019-06-18 MED ORDER — POTASSIUM CHLORIDE CRYS ER 20 MEQ PO TBCR
20.0000 meq | EXTENDED_RELEASE_TABLET | Freq: Three times a day (TID) | ORAL | Status: DC
  Administered 2019-06-18 (×2): 20 meq via ORAL
  Filled 2019-06-18 (×2): qty 1

## 2019-06-18 MED ORDER — NOREPINEPHRINE 4 MG/250ML-% IV SOLN: 0.0000 ug/min | INTRAVENOUS | Status: DC

## 2019-06-18 MED ORDER — HEPARIN (PORCINE) IN NACL 1000-0.9 UT/500ML-% IV SOLN
INTRAVENOUS | Status: DC | PRN
  Administered 2019-06-18 (×2): 500 mL

## 2019-06-18 MED ORDER — LIDOCAINE HCL (PF) 1 % IJ SOLN
INTRAMUSCULAR | Status: AC
  Filled 2019-06-18: qty 30

## 2019-06-18 MED ORDER — LIDOCAINE BOLUS VIA INFUSION
100.0000 mg | Freq: Once | INTRAVENOUS | Status: AC
  Administered 2019-06-18: 100 mg via INTRAVENOUS
  Filled 2019-06-18: qty 100

## 2019-06-18 MED ORDER — MAGNESIUM SULFATE 4 GM/100ML IV SOLN
INTRAVENOUS | Status: AC
  Filled 2019-06-18: qty 100

## 2019-06-18 MED ORDER — CALCIUM CHLORIDE 10 % IV SOLN
INTRAVENOUS | Status: AC
  Filled 2019-06-18: qty 10

## 2019-06-18 MED ORDER — METOLAZONE 2.5 MG PO TABS
2.5000 mg | ORAL_TABLET | Freq: Once | ORAL | Status: AC
  Administered 2019-06-18: 2.5 mg via ORAL
  Filled 2019-06-18: qty 1

## 2019-06-18 MED ORDER — LIDOCAINE HCL (PF) 1 % IJ SOLN
INTRAMUSCULAR | Status: DC | PRN
  Administered 2019-06-18: 15 mL

## 2019-06-18 MED ORDER — EPINEPHRINE HCL 5 MG/250ML IV SOLN IN NS
0.5000 ug/min | INTRAVENOUS | Status: DC
  Filled 2019-06-18: qty 250

## 2019-06-18 MED ORDER — MIDAZOLAM 50MG/50ML (1MG/ML) PREMIX INFUSION: 0.5000 mg/h | INTRAVENOUS | Status: DC

## 2019-06-18 MED ORDER — POTASSIUM CHLORIDE CRYS ER 20 MEQ PO TBCR: 40.0000 meq | EXTENDED_RELEASE_TABLET | Freq: Once | ORAL | Status: DC

## 2019-06-18 MED ORDER — POTASSIUM CHLORIDE 10 MEQ/50ML IV SOLN
10.0000 meq | INTRAVENOUS | Status: AC
  Administered 2019-06-18: 10 meq via INTRAVENOUS
  Filled 2019-06-18: qty 50

## 2019-06-18 MED FILL — Medication: Qty: 2 | Status: AC

## 2019-06-18 SURGICAL SUPPLY — 21 items
BAG SNAP BAND KOVER 36X36 (MISCELLANEOUS) ×1 IMPLANT
BALLN IABP SENSA PLUS 7.5F 40C (BALLOONS) ×2
BALLOON IABP SENS PLUS 7.5F40C (BALLOONS) IMPLANT
CABLE ADAPT CONN TEMP 6FT (ADAPTER) ×1 IMPLANT
CATH INFINITI 5 FR IM (CATHETERS) ×1 IMPLANT
CATH INFINITI 5FR JL4 (CATHETERS) ×1 IMPLANT
CATH INFINITI 5FR MPB2 (CATHETERS) ×1 IMPLANT
CATH INFINITI JR4 5F (CATHETERS) ×1 IMPLANT
CATH S G BIP PACING (CATHETERS) ×1 IMPLANT
CATH SWAN GANZ VIP 7.5F (CATHETERS) ×1 IMPLANT
COVER DOME SNAP 22 D (MISCELLANEOUS) ×2 IMPLANT
HOVERMATT SINGLE USE (MISCELLANEOUS) ×1 IMPLANT
KIT HEART LEFT (KITS) ×2 IMPLANT
PACK CARDIAC CATHETERIZATION (CUSTOM PROCEDURE TRAY) ×2 IMPLANT
SHEATH PINNACLE 6F 10CM (SHEATH) ×1 IMPLANT
SHEATH PINNACLE 7F 10CM (SHEATH) ×1 IMPLANT
SHEATH PROBE COVER 6X72 (BAG) ×1 IMPLANT
SLEEVE REPOSITIONING LENGTH 30 (MISCELLANEOUS) ×2 IMPLANT
TRANSDUCER W/STOPCOCK (MISCELLANEOUS) ×2 IMPLANT
TUBING CIL FLEX 10 FLL-RA (TUBING) ×2 IMPLANT
WIRE EMERALD 3MM-J .035X150CM (WIRE) ×1 IMPLANT

## 2019-06-19 ENCOUNTER — Encounter (HOSPITAL_COMMUNITY): Payer: Self-pay | Admitting: Interventional Cardiology

## 2019-06-19 MED FILL — Potassium Chloride Inj 2 mEq/ML: INTRAVENOUS | Qty: 40 | Status: AC

## 2019-06-19 MED FILL — Electrolyte-R (PH 7.4) Solution: INTRAVENOUS | Qty: 3000 | Status: AC

## 2019-06-19 MED FILL — Heparin Sodium (Porcine) Inj 1000 Unit/ML: INTRAMUSCULAR | Qty: 30 | Status: AC

## 2019-06-19 MED FILL — Mannitol IV Soln 20%: INTRAVENOUS | Qty: 500 | Status: AC

## 2019-06-19 MED FILL — Lidocaine HCl Local Soln Prefilled Syringe 100 MG/5ML (2%): INTRAMUSCULAR | Qty: 5 | Status: AC

## 2019-06-19 MED FILL — Nitroglycerin IV Soln 100 MCG/ML in D5W: INTRA_ARTERIAL | Qty: 10 | Status: AC

## 2019-06-19 MED FILL — Sodium Chloride IV Soln 0.9%: INTRAVENOUS | Qty: 2000 | Status: AC

## 2019-06-19 MED FILL — Amiodarone HCl Inj 150 MG/3ML (50 MG/ML): INTRAVENOUS | Qty: 3 | Status: AC

## 2019-06-19 MED FILL — Magnesium Sulfate Inj 50%: INTRAMUSCULAR | Qty: 10 | Status: AC

## 2019-06-19 MED FILL — Sodium Bicarbonate IV Soln 8.4%: INTRAVENOUS | Qty: 50 | Status: AC

## 2019-06-23 NOTE — Progress Notes (Signed)
Pt experiencing intermittent runs of Vtach (4-6 beats at a time), nonsustained and asymptomatic. Dr Kipp Brood paged, and order placed to attempt to wean off Dopamine gtt. Magnesium and BMET ordered, since pt with significant UOP following IV Lasix this am. Mg level 2.0 and K level 3.5, so PharmD, Eugenia Pancoast, ordered 3 runs KCL IV via central line. Will continue to monitor pt closely.

## 2019-06-23 NOTE — Consult Note (Signed)
NAME:  Nicole Moreno, MRN:  [id number], DOB:  [birth year], LOS: 5 ADMISSION DATE:  05/24/2019, CONSULTATION DATE:  05/25/2019 REFERRING MD:  Cyndia Bent, CHIEF COMPLAINT:  Code blue   Brief History   Patient POD #2 from CABG x 3 with Vfib/Vtach storm cardiac arrest requiring 40 minutes of CPR to convert to [facility] ECMO.  History of present illness   68 year old woman with no past medical history who presented to [facility] with 2 to 3 weeks of shortness of breath found to have concerning cardiac markers.  She underwent a ischemic evaluation here and was recommended for CABG which was performed on 05/29/2019.  Unfortunately this evening shortly after speaking with her nurse regarding television shows, she had a witnessed polymorphic V. fib arrest.  She underwent about 40 minutes of CPR and multiple shocks, bicarb pushes, epinephrine, lidocaine, amiodarone before achieving ROSC.  Cardiothoracic surgery at bedside and planning to cannulate for [facility] ECMO.  PCCM asked to help manage the ventilator.  Past Medical History  No known past medical history  Significant Hospital Events   11/04/202023 vessel CABG by Dr. Cyndia Bent  Consults:  PCCM, TCTS, Cardiology  Procedures:  Intubation, arterial line 06/20/2019  Significant Diagnostic Tests:  Chest x-ray pending  Micro Data:  None  Antimicrobials:  To start Vanco/Zosyn for now  Interim history/subjective:  CODE BLUE, see separate note, patient is obtunded on mechanical ventilation  Objective   Blood pressure 107/64, pulse 76, temperature 98.5 F (36.9 C), temperature source Oral, resp. rate 15, height 5' 6.5" (1.689 m), weight 113.2 kg, SpO2 99 %.        Intake/Output Summary (Last 24 hours) at 05/28/2019 0865 Last data filed at 06/02/2019 1700 Gross per 24 hour  Intake 1342.79 ml  Output 4165 ml  Net -2822.21 ml   Filed Weights   06/20/2019 0454 06/17/19 0600 06/10/2019 0402  Weight: 109 kg 113.5 kg 113.2 kg    Examination: GEN: Ill-appearing  obese woman in no acute distress HEENT: Endotracheal tube in place with minimal secretions CV: Heart sounds are regular, extremities cool PULM: Scattered rhonchi, passive on ventilator GI: Soft, hypoactive bowel sounds EXT: Minimal edema NEURO: Her pupils are reactive, she is otherwise unresponsive PSYCH: Cannot assess SKIN: Ashen skin tone   Resolved Hospital Problem list   NA  Assessment & Plan:  Prolonged V. tach/V. fib arrest after CABG.  Bedside echo did not show any pericardial effusion.  She had bilateral breath sounds.  Her electrolytes earlier in the day were benign.  A chest x-ray is pending.   - ECMO cannulation, AC, and management per TCTS and CHF team. - Would target pH greater than 7.3 until cannulated then would place on 67mL/kg ideal body weight and adjust sleep to target pH greater than 7.3 - Will follow with you  Best practice:  Diet: NPO Pain/Anxiety/Delirium protocol (if indicated): Per primary VAP protocol (if indicated): In place DVT prophylaxis: Per primary GI prophylaxis: PPI Glucose control: SSI, hold levemir, low threshold for insulin gtt Mobility: BR Code Status: Per primary discussion Family Communication: Per primary Disposition: ICU in very guarded condition  Labs   CBC: Recent Labs  Lab 05/25/2019 1507  06/10/2019 1304  06/22/2019 2006 06/01/2019 2122 06/17/19 0402 06/17/19 1822 06/22/2019 0456  WBC 12.8*   < > 23.9*  --  19.0*  --  14.9* 15.2* 15.2*  NEUTROABS 9.9*  --   --   --   --   --   --   --   --  HGB 14.3   < > 11.5*   < > 11.6* 11.2* 10.5* 10.8* 10.8*  HCT 43.7   < > 35.7*   < > 35.0* 33.0* 32.6* 33.8* 33.0*  MCV 92.4   < > 93.7  --  91.9  --  93.9 94.9 91.9  PLT 263   < > 233  --  246  --  212 187 187   < > = values in this interval not displayed.    Basic Metabolic Panel: Recent Labs  Lab 06/10/2019 0436  06/11/2019 0346  06/17/2019 1203  06/05/2019 2006 06/17/2019 2122 06/17/19 0402 06/17/19 1822 06/07/2019 0456 06/01/2019 1609  NA  142   < > 140   < > 141   < >  --  141 140 139 140 138  K 3.3*   < > 3.3*   < > 3.8   < >  --  3.6 3.5 3.5 4.1 3.5  CL 104   < > 102   < > 104  --   --   --  106 103 102 98  CO2 27   < > 24  --   --   --   --   --  27 27 28 31   GLUCOSE 139*   < > 142*   < > 206*  --   --   --  141* 206* 176* 189*  BUN 17   < > 20   < > 19  --   --   --  13 13 12 11   CREATININE 0.79   < > 0.96   < > 0.70  --  0.76  --  0.72 0.97 1.03* 0.84  CALCIUM 9.4   < > 9.4  --   --   --   --   --  8.5* 8.8* 9.1 9.3  MG 2.0  --   --   --   --   --  2.7*  --  2.4 2.1  --  2.0   < > = values in this interval not displayed.   GFR: Estimated Creatinine Clearance: 82.6 mL/min (by C-G formula based on SCr of 0.84 mg/dL). Recent Labs  Lab 10-05-18 1813  06/09/2019 2006 06/17/19 0402 06/17/19 1822 06/13/2019 0456  PROCALCITON <0.10  --   --   --   --   --   WBC  --    < > 19.0* 14.9* 15.2* 15.2*   < > = values in this interval not displayed.    Liver Function Tests: Recent Labs  Lab 05/27/2019 1631  AST 29  ALT 51*  ALKPHOS 87  BILITOT 0.9  PROT 6.6  ALBUMIN 3.4*   No results for input(s): LIPASE, AMYLASE in the last 168 hours. No results for input(s): AMMONIA in the last 168 hours.  ABG    Component Value Date/Time   PHART 7.359 05/30/2019 2122   PCO2ART 46.7 05/28/2019 2122   PO2ART 79.0 (L) 06/04/2019 2122   HCO3 26.4 06/11/2019 2122   TCO2 28 06/08/2019 2122   ACIDBASEDEF 1.0 05/25/2019 1848   O2SAT 66.7 06/12/2019 0535     Coagulation Profile: Recent Labs  Lab 10-05-18 1813 06/04/2019 1631 06/20/2019 1304  INR 1.1 1.1 1.3*    Cardiac Enzymes: No results for input(s): CKTOTAL, CKMB, CKMBINDEX, TROPONINI in the last 168 hours.  HbA1C: Hgb A1c MFr Bld  Date/Time Value Ref Range Status  05/29/2019 08:28 AM 6.6 (H) 4.8 - 5.6 % Final  Comment:    (NOTE) Pre diabetes:          5.7%-6.4% Diabetes:              >6.4% Glycemic control for   <7.0% adults with diabetes     CBG: Recent Labs   Lab 06/17/19 1631 06/17/19 2127 06/21/2019 0825 05/25/2019 1146 06/05/2019 1537  GLUCAP 144* 180* 220* 181* 205*    Review of Systems:   Cannot assess  Past Medical History  She,  has no past medical history on file.   Surgical History    Past Surgical History:  Procedure Laterality Date  . CORONARY ARTERY BYPASS GRAFT N/A 06/06/2019   Procedure: CORONARY ARTERY BYPASS GRAFTING (CABG) X3  ON PUMP USING LEFT INTERNAL MAMMARY ARTERY AND RIGHT GREATER SAPHENOUS ENDOSCOPICALLY HARVESTED VEIN GRAFTS;  Surgeon: Alleen Borne, MD;  Location: [facility] OR;  Service: Open Heart Surgery;  Laterality: N/A;  . LEFT HEART CATH AND CORONARY ANGIOGRAPHY N/A 06/21/2019   Procedure: LEFT HEART CATH AND CORONARY ANGIOGRAPHY;  Surgeon: Yvonne Kendall, MD;  Location: [facility] INVASIVE CV LAB;  Service: Cardiovascular;  Laterality: N/A;  . TEE WITHOUT CARDIOVERSION N/A 06/20/2019   Procedure: TRANSESOPHAGEAL ECHOCARDIOGRAM (TEE);  Surgeon: Alleen Borne, MD;  Location: [facility] OR;  Service: Open Heart Surgery;  Laterality: N/A;     Social History   reports that she quit smoking about 14 years ago. She has never used smokeless tobacco. She reports that she does not drink alcohol or use drugs.   Family History   Her family history includes CAD in her father; CVA in her father; Cancer in her brother and sister.   Allergies No Known Allergies   Home Medications  Prior to Admission medications   Medication Sig Start Date End Date Taking? Authorizing Provider  aspirin 81 MG chewable tablet Chew 81 mg by mouth daily as needed for mild pain or moderate pain.   Yes [provider]  ibuprofen (ADVIL) 200 MG tablet Take 200 mg by mouth every 6 (six) hours as needed for fever or moderate pain.   Yes [provider]     Critical care time: 32 minutes not including any separately billable procedures

## 2019-06-23 NOTE — Progress Notes (Signed)
Responded to code blue, already in progress.  Noted Unit RT already at [facility] and ambu bag mask ventilating on oxygen.  During the code, ICU MD Dr Valeta Harms intubated pt w/ no apparent complications noted.  + BBSH = t/o w/ good air movement.  + EzCap color change (purple to yellow).  Pt ambu bagged on 100% oxygen t/o remainder of code by myself and unit RT.

## 2019-06-23 NOTE — CV Procedure (Signed)
   Left heart cath with coronary and bypass angiography, and temporary transvenous pacemaker via right femoral using real time vascular US for access.  Patent vein grafts to RCA and OM. The SVG to RCA with sluggish flow due to poor runoff and diffuse native vasoconstriction from pressors.  Patent LIMA to LAD  Known total occlusion of native RCA, severe proximal LAD, and severe mid and distal Cfx.  LVEDP 30 mmHg  Temporary pacemaker via right femoral vein with threshold 0.5 mA; Rate 50 bpm vvi mode; 5 mA output.  No complications.

## 2019-06-23 NOTE — Progress Notes (Signed)
This chaplain responded to Pt. Code Blue with the medical team.  Chg RN-Tanya shared with chaplain family has been notified and in route.  The chaplain will be spiritually present as needed.

## 2019-06-23 NOTE — Progress Notes (Signed)
     [address]       [place] [address]             [phone]        Called for VT arrest.   Pt had malignant polymorphic VT.  She was shocked several times, but only maintained a perfusable rhythm for 30-60s.  Amio was bolused several times, and lidocaine gtt was started.  After about 25 minutes she maintained a rhythm.  She was taken to the cath lab, where all grafts were open, with some vasospasm in the native arteries.    ECMO team is currently at the bedside in the cath lab.  Mosie Angus Bary Leriche

## 2019-06-23 NOTE — Procedures (Signed)
Arterial Catheter Insertion Procedure Note Nicole Moreno [id number] May 07, 1951  Procedure: Insertion of Arterial Catheter  Indications: Blood pressure monitoring  Procedure Details Consent: Risks of procedure as well as the alternatives and risks of each were explained to the (patient/caregiver).  Consent for procedure obtained. Time Out: Verified patient identification, verified procedure, site/side was marked, verified correct patient position, special equipment/implants available, medications/allergies/relevent history reviewed, required imaging and test results available.  Performed  Maximum sterile technique was used including antiseptics, cap, gloves, gown, hand hygiene, mask and sheet. Skin prep: Chlorhexidine; local anesthetic administered 20 gauge catheter was inserted into left femoral artery using the Seldinger technique. ULTRASOUND GUIDANCE USED: YES Evaluation Blood flow good; BP tracing good. Complications: No apparent complications.   Candee Furbish 05/26/2019

## 2019-06-23 NOTE — Code Documentation (Signed)
Pt completed an uneventful walk around the unit on tele monitoring and 3L [place] O2 with RN accompanying her. Pt said she felt "great" and was "breathing so much better." Pt was assisted back to bed, and she called her son. Soon after she hung up the phone, she conversed briefly with this bedside RN. Around 1740, pt was noted to have gone into sustained Vtach and became obtunded. Code button was pulled and chest compressions immediately started. Code Blue initiated and see code sheet for details.

## 2019-06-23 NOTE — Progress Notes (Signed)
   The ECMO team was called to evaluate Nicole Moreno for possible [facility] ECMO deployment due to cardiac arrest with refractory torsades/VF.  She was a 68 y/o obese woman with new recent onset DM2. Admitted with NSTEMI. EF found to be 30-35% with severe 3v CAD. Underwent 3v CABG by Dr. Cyndia Bent on 11/24.   Was up and walking today. This evening developed refractory torsades/VF.   On my arrival to the bedside, the case was discussed with Dr. Kipp Brood.   At the time of assessment she was on the vent in a junctional rhythm in the 50-60 range. She was on EPI 20 and Norepi 40. Pupils were dilated and relatively non-reactive.   It was felt that the patient likely lost a graft. After discussions with Dr. Kipp Brood and the ECMO team it was felt that she was likely not a candidate for emergent ECMO due tot the prolonged code.   She was brought emergently to the cath lab for angiography by Dr. Tamala Julian. She was stable throughout the angiography which showed patent grafts with severe spasm of the native arteries due to high-dose pressors. During the angiography portion of the case, I managed her pressors and hemodynamics.   Once angiography was complete, we adjusted her ventilator with support from the CCM team to improve oxygenation. The decision was made to place a Swan catheter to further assess hemodynamics prior to returning to the CCU.   As Luiz Blare was being inserted, the patient's rhythm again deteriorated. The existing 6FR sheath in the RFA was emergently exchanged for a 7.5 IABP sheath and a 40cc IABP was placed in the descending aorta and emergent counterpulsation was begun in an effort to stabilize her rhythm and coronary flow.   Unfortunately, despite full support, the patient had refractory torsades/VT despite multiple defibrillations, hemodynamic support and lidocaine/amio drips.  The ECMO and TCTS teams, including Drs. Atkins and Lightfoot, were in the cath lab and the decision to deploy [facility] ECMO was  discussed and we decided not to proceed due to prolonged CPR, concern for anoxic brain injury and lack of a forseeable/satisfactory exit strategy.   After over 20 defibrillations in the cath lab and ongoing CPR, I personally called the code at 2019.  CRITICAL CARE Performed by: Glori Bickers  Total critical care time: > 90 minutes  Critical care time was exclusive of separately billable procedures.   Glori Bickers, MD  9:01 PM

## 2019-06-23 NOTE — Progress Notes (Signed)
2 Days Post-Op Procedure(s) (LRB): CORONARY ARTERY BYPASS GRAFTING (CABG) X3  ON PUMP USING LEFT INTERNAL MAMMARY ARTERY AND RIGHT GREATER SAPHENOUS ENDOSCOPICALLY HARVESTED VEIN GRAFTS (N/A) TRANSESOPHAGEAL ECHOCARDIOGRAM (TEE) (N/A) Subjective: No complaints, ambulated this am.  Objective: Vital signs in last 24 hours: Temp:  [98.2 F (36.8 C)-98.6 F (37 C)] 98.3 F (36.8 C) (11/26 0410) Pulse Rate:  [75-87] 77 (11/26 0600) Cardiac Rhythm: Normal sinus rhythm (11/26 0400) Resp:  [16-33] 19 (11/26 0600) BP: (100-138)/(37-73) 124/70 (11/26 0600) SpO2:  [90 %-100 %] 99 % (11/26 0600) Arterial Line BP: (114-134)/(46-55) 114/46 (11/25 1300) Weight:  [113.2 kg] 113.2 kg (11/26 0402)  Hemodynamic parameters for last 24 hours: PAP: (35-39)/(16-21) 35/16  Intake/Output from previous day: 11/25 0701 - 11/26 0700 In: 2101.8 [P.O.:720; I.V.:1181.7; IV Piggyback:200.1] Out: 2160 [Urine:1770; Chest Tube:390] Intake/Output this shift: No intake/output data recorded.  General appearance: alert and cooperative Neurologic: intact Heart: regular rate and rhythm, S1, S2 normal, no murmur, click, rub or gallop Lungs: diminished breath sounds bibasilar Extremities: edema moderate Wound: dressings dry  Lab Results: Recent Labs    06/17/19 1822 06/10/2019 0456  WBC 15.2* 15.2*  HGB 10.8* 10.8*  HCT 33.8* 33.0*  PLT 187 187   BMET:  Recent Labs    06/17/19 1822 06/11/2019 0456  NA 139 140  K 3.5 4.1  CL 103 102  CO2 27 28  GLUCOSE 206* 176*  BUN 13 12  CREATININE 0.97 1.03*  CALCIUM 8.8* 9.1    PT/INR:  Recent Labs    05/28/2019 1304  LABPROT 16.4*  INR 1.3*   ABG    Component Value Date/Time   PHART 7.359 06/14/2019 2122   HCO3 26.4 05/28/2019 2122   TCO2 28 06/10/2019 2122   ACIDBASEDEF 1.0 06/01/2019 1848   O2SAT 66.7 06/09/2019 0535   CBG (last 3)  Recent Labs    06/17/19 1131 06/17/19 1631 06/17/19 2127  GLUCAP 152* 144* 180*   CXR: bilateral lower lobe  atelectasis. Interstitial edema improving.  Assessment/Plan: S/P Procedure(s) (LRB): CORONARY ARTERY BYPASS GRAFTING (CABG) X3  ON PUMP USING LEFT INTERNAL MAMMARY ARTERY AND RIGHT GREATER SAPHENOUS ENDOSCOPICALLY HARVESTED VEIN GRAFTS (N/A) TRANSESOPHAGEAL ECHOCARDIOGRAM (TEE) (N/A)  POD 2  Hemodynamically stable. Co-ox 67%. Will decrease milrinone to 0.125. Continue dopamine 3. Preop EF 30-35% with pulmonary edema preop.  Volume excess: Wt is 10 lbs over preop and was volume overloaded at that point. Continue diuresis with lasix and add metolazone today. Replete K+  DM: glucose under adequate control on current regimen.  DC pleural tubes today.  IS, ambulation.     LOS: 5 days    Nicole Moreno 06/05/2019

## 2019-06-23 NOTE — Code Documentation (Signed)
PCCM:  Paged to the bedside urgently for patient and cardiac arrest.  Previously sitting up awake talking in the room.  Acute decompensation with pulseless V. tach, torsades and degenerative EF.  Patient received immediate chest compressions.  During the code she received a total of greater than 10 epi pushes as well as multiple cardioversion attempts.  She was in refractory VT would be in pulse for just a few moments and then go right back into VT that would degenerate to VF.  With continued chest compressions patient received multiple boluses of amiodarone.  As well as lidocaine.  The code lasted approximately 1 hour and received cardioversions of greater than 30 times.  She was endotracheally intubated with glidescope at bedside.  Cardiothoracic surgery was contacted during the middle of the code.  Plans for VA ECMO Cannulation for refractory VT, 2 days post-op CABG.   This patient is critically ill with multiple organ system failure; which, requires frequent high complexity decision making, assessment, support, evaluation, and titration of therapies. This was completed through the application of advanced monitoring technologies and extensive interpretation of multiple databases. During this encounter critical care time was devoted to patient care services described in this note for 65 minutes.   Garner Nash, DO [facility] 06/02/2019 6:55 PM

## 2019-06-23 NOTE — Progress Notes (Signed)
   06/11/2019 2013  Clinical Encounter Type  Visited With Family  Visit Type Initial  Referral From Nurse  Consult/Referral To Chaplain  Spiritual Encounters  Spiritual Needs Prayer;Emotional;Grief support  Stress Factors  Family Stress Factors Health changes  This chaplain responded to RN call for spiritual care and companionship with the Pt. son-Brian after the Pt. Code Blue. The chaplain listened as the MD communicated the Pt. plan of care with Aaron Edelman. The chaplain remained present with Aaron Edelman as he found a place of comfort and waited for the Pt. husband-L.A. and sister Jeannene Patella to arrive at the hospital.  The chaplain returned when the family was notified of the Pt. death. The chaplain's time with the family consisted of prayer and sharing memories of the Pt.  The chaplain shared the RN's grief packet with the family.  The spiritual care department is available for F/U spiritual care as needed.

## 2019-06-23 NOTE — Procedures (Signed)
Intubation Procedure Note Nicole Moreno [id number] [birth year]  Procedure: Intubation Indications: Respiratory insufficiency  Procedure Details Consent: Risks of procedure as well as the alternatives and risks of each were explained to the (patient/caregiver).  Consent for procedure obtained. Time Out: Verified patient identification, verified procedure, site/side was marked, verified correct patient position, special equipment/implants available, medications/allergies/relevent history reviewed, required imaging and test results available.  Performed  Maximum sterile technique was used including gloves, gown, hand hygiene and mask.  MAC 3, glidescope Grade 1 view of VC  Evaluation Hemodynamic Status: Persistent hypotension treated with pressors and fluid; O2 sats: transiently fell during during procedure Patient's Current Condition: critically ill  Complications: No apparent complications Patient did tolerate procedure well. Chest X-ray ordered to verify placement.  CXR: pending.   Octavio Graves Corene Resnick 06-19-2019

## 2019-06-23 DEATH — deceased

## 2019-06-25 ENCOUNTER — Ambulatory Visit: Payer: Medicare Other | Admitting: General Practice

## 2019-06-25 NOTE — Anesthesia Postprocedure Evaluation (Signed)
Anesthesia Post Note  Patient: Nicole Moreno  Procedure(s) Performed: CORONARY ARTERY BYPASS GRAFTING (CABG) X3  ON PUMP USING LEFT INTERNAL MAMMARY ARTERY AND RIGHT GREATER SAPHENOUS ENDOSCOPICALLY HARVESTED VEIN GRAFTS (N/A Chest) TRANSESOPHAGEAL ECHOCARDIOGRAM (TEE) (N/A )     Patient location during evaluation: SICU Anesthesia Type: General Level of consciousness: sedated Pain management: pain level controlled Vital Signs Assessment: post-procedure vital signs reviewed and stable Respiratory status: patient remains intubated per anesthesia plan Cardiovascular status: stable Postop Assessment: no apparent nausea or vomiting Anesthetic complications: no            Meri Pelot

## 2019-07-01 ENCOUNTER — Ambulatory Visit: Payer: Medicare Other | Admitting: Physician Assistant

## 2019-07-10 ENCOUNTER — Encounter (HOSPITAL_COMMUNITY): Payer: Self-pay | Admitting: *Deleted

## 2019-07-10 NOTE — Progress Notes (Signed)
Death Certificate completed and signed by Dr Haroldine Laws.  Faxed a copy to [email or web address], they are aware to p/u original.

## 2019-07-15 ENCOUNTER — Ambulatory Visit: Payer: Medicare Other | Admitting: Surgery

## 2019-07-24 NOTE — Death Summary Note (Signed)
DEATH SUMMARY   Patient Details  Name: Nicole Moreno MRN: [id number] DOB: Jun 29, 1951  Admission/Discharge Information   Admit Date:  06/05/2019  Date of Death: Date of Death: 06/20/2019  Time of Death: Time of Death: 2019  Length of Stay: 5  Referring Physician: Patient, No Pcp Per   Reason(s) for Hospitalization   Severe multi-vessel CAD  Diagnoses  Preliminary cause of death:  Secondary Diagnoses (including complications and co-morbidities):  Active Problems:   Acute respiratory failure (HCC)   NSTEMI (non-ST elevated myocardial infarction) (HCC)   ACS (acute coronary syndrome) (HCC)   Acute heart failure (HCC)   Non-ST elevation (NSTEMI) myocardial infarction (HCC)   Elevated troponin   Pure hypercholesterolemia   Hypokalemia   DCM (dilated cardiomyopathy) (HCC)   S/P CABG x 3   Cardiac arrest, cause unspecified (HCC)   Ventricular tachycardia [facility](HCC)   Patient Active Problem List   Diagnosis Date Noted  . Cardiac arrest, cause unspecified (HCC)   . Ventricular tachycardia (HCC)   . S/P CABG x 3 08-22-2018  . Acute heart failure (HCC)   . Non-ST elevation (NSTEMI) myocardial infarction (HCC)   . Elevated troponin   . Pure hypercholesterolemia   . Hypokalemia   . DCM (dilated cardiomyopathy) (HCC)   . NSTEMI (non-ST elevated myocardial infarction) (HCC) 06/14/2019  . ACS (acute coronary syndrome) (HCC) 06/14/2019  . Acute respiratory failure (HCC) 06/06/2019    Brief Hospital Course (including significant findings, care, treatment, and services provided and events leading to death)   HPI:   The patient is a 69 year old woman with any significant medical history, remote smoker and family history of heart disease who suddenly developed exertional shortness of breath and fatigue about a week and a half before admission. She did not have any chest pain or pressure. She says she had to sleep sitting up and the shortness of breath progressed over the next week or  so. She denies any peripheral edema. She presented to [facility] on Saturday and had elevated HsT of 5781. 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 She was initially tachycardic and tachypneic with sats of 88% on RA. CXR showed diffuse perihilar hazy lung opacities suggesting pulmonary edema. Covid negative. She was diuresed. CTA planned to ruled out PE but she could not lay flat in the scanner and it had to be aborted. [facility]. Echo showed an EF of 30-35% with no significant valvular disease. Cath today shows severe 3 vessel CAD with an elevated LvEDP of 30. She says she has felt much better since diuresis and was able to lay flat last night.   The patient was seen in cardiothoracic surgical consultation by Dr. Laneta SimmersBartle who evaluated the patient and studies and agreed with recommendations to proceed with CABG.  On 08-22-2018 she was taken to the operating room where she underwent the below described procedure.  She initially tolerated it well and was taken to the surgical intensive care unit in stable condition.  Postoperative hospital course:  The patient initially did well maintaining stable hemodynamics on low-dose milrinone and dopamine.  She was able to be weaned from the ventilator without significant difficulty.  She did have some volume overload but responded well to diuretics  with good urine output.  On 11/26 in the early evening, the patient went into cardiac arrest with acute decompensation with pulseless V. tach, torsades and degenerative ejection fraction.  Critical care medicine was acutely consulted.  She received immediate chest compressions and multiple rounds of epinephrine and cardioversion..  Both amiodarone and lidocaine were also initiated.  She was intubated as well in cardiothoracic surgery was promptly consulted to consider emergent [facility] ECMO cannulation for the refractory V. tach.  Additionally Dr. Madelynn Done with the advanced heart  failure team was brought in to assist with management.  She was placed on multiple pressors including epinephrine and norepinephrine.  Due to the prolonged code she was not felt to be a candidate for urgent ECMO placement.  She was taken emergently to the cardiac catheterization lab where her grafts were found to be patent but there was evidence of severe spasm of her native arteries felt to be due to the high-dose pressor support.  The patient remained unstable and after discussion with the entire team it was felt that she would not survive and she was pronounced deceased.   Pertinent Labs and Studies  Significant Diagnostic Studies Dg Chest 2 View  Result Date: 06/03/2019 CLINICAL DATA:  Worsening dyspnea EXAM: CHEST - 2 VIEW COMPARISON:  None. FINDINGS: Borderline enlargement of the cardiopericardial silhouette. Otherwise normal mediastinal contour. No pneumothorax. Trace right pleural effusion. No left pleural effusion. Diffuse linear and hazy parahilar lung opacities. IMPRESSION: 1. Diffuse linear and hazy parahilar lung opacities, favor pulmonary edema given borderline mild enlargement of the cardiopericardial silhouette, with atypical infection on the differential. 2. Trace right pleural effusion. Electronically Signed   By: Delbert Phenix M.D.   On: 06/04/2019 15:47   Dg Chest Port 1 View  Result Date: 06/20/2019 CLINICAL DATA:  Follow-up CABG. Chest tube in place. EXAM: PORTABLE CHEST 1 VIEW COMPARISON:  06/17/2019 FINDINGS: Right IJ central venous sheath remains in place with tip over the SVC. Bilateral chest tubes unchanged. Lungs are adequately inflated without evidence of pneumothorax. Mild persistent hazy left base/retrocardiac opacification likely effusions/atelectasis. Stable mild opacification over the medial right upper lobe/apex. Minimal atelectasis right base. Stable cardiomegaly. Remainder of the exam is unchanged. IMPRESSION: 1. No significant interval change in appearance of the  chest. 2. No evidence of pneumothorax. 3. Tubes and lines as described. Electronically Signed   By: Elberta Fortis M.D.   On: 05/26/2019 09:00   Dg Chest Port 1 View  Result Date: 06/17/2019 CLINICAL DATA:  Chest tube.  Prior CABG. EXAM: PORTABLE CHEST 1 VIEW COMPARISON:  06/14/2019. FINDINGS: Interim extubation and removal of NG tube. Mediastinal drainage catheter, Swan-Ganz catheter, bilateral chest tubes are in stable position. Prior CABG. Stable cardiomegaly. Diffuse bilateral interstitial prominence noted suggesting CHF. Low lung volumes with worsening atelectatic changes and/or infiltrates in the lung bases. Small left pleural effusion cannot be excluded. No pneumothorax. IMPRESSION: 1. Interim extubation removal of NG tube. Remaining lines and tubes including bilateral chest tubes in stable position. No pneumothorax. 2. Prior CABG. Stable cardiomegaly. Diffuse bilateral from interstitial prominence noted suggesting CHF. 3. Low lung volumes with worsening atelectatic changes and/or infiltrates in the lung bases. Small left pleural effusion cannot be excluded. Electronically Signed   By: Maisie Fus  Register   On: 06/17/2019 07:33   Dg Chest Port 1 View  Result Date: 06/22/2019 CLINICAL DATA:  Status post CABG EXAM: PORTABLE CHEST 1 VIEW COMPARISON:  06/11/2019 FINDINGS: Interval postoperative findings of median sternotomy and CABG with extensive support apparatus including endotracheal tube, tip projecting over the mid trachea, esophagogastric tube, the tip and side port below the diaphragm, and right neck pulmonary arterial catheter, tip directed over the right pulmonary artery. Bilateral chest and mediastinal drainage  tubes in position without significant pneumothorax. Mild diffuse interstitial opacity, consistent with edema, with likely small pleural effusions. Cardiomegaly. IMPRESSION: 1. Postoperative changes as above. 2. Mild interstitial edema and likely small pleural effusions. 3. No pneumothorax  or pneumomediastinum. Electronically Signed   By: Lauralyn Primes M.D.   On: 2019-07-09 14:17   Dg Chest Portable 1 View  Result Date: 05/29/2019 CLINICAL DATA:  Worsening shortness of breath. EXAM: PORTABLE CHEST 1 VIEW COMPARISON:  06/21/2019 at 3:21 a.m. FINDINGS: Lung opacities have increased compared to the earlier exam. There are interstitial and hazy airspace opacities bilaterally, centrally predominant. Lung base opacity partially obscures the hemidiaphragms suggesting small effusions. IMPRESSION: 1. Worsened lung aeration compared to the earlier exam. Increased interstitial and hazy airspace lung opacities. Pulmonary edema favored as the etiology. Electronically Signed   By: Amie Portland M.D.   On: 06/22/2019 18:38   Vas US Doppler Pre Cabg  Result Date: July 09, 2019 PREOPERATIVE VASCULAR EVALUATION  Risk Factors:     Diabetes, coronary artery disease. Comparison Study: No prior study on file Performing Technologist: Olen Cordial Rvt Supporting Technologist: Sherren Kerns RVS  Examination Guidelines: A complete evaluation includes B-mode imaging, spectral Doppler, color Doppler, and power Doppler as needed of all accessible portions of each vessel. Bilateral testing is considered an integral part of a complete examination. Limited examinations for reoccurring indications may be performed as noted.  Right Carotid Findings: +----------+--------+--------+--------+--------+--------+           PSV cm/sEDV cm/sStenosisDescribeComments +----------+--------+--------+--------+--------+--------+ CCA Prox  93      16                               +----------+--------+--------+--------+--------+--------+ CCA Distal63      19                               +----------+--------+--------+--------+--------+--------+ ICA Prox  78      27              calcific         +----------+--------+--------+--------+--------+--------+ ICA Distal92      30                                +----------+--------+--------+--------+--------+--------+ ECA       186     36                               +----------+--------+--------+--------+--------+--------+ Portions of this table do not appear on this page. +----------+--------+-------+--------------+------------+           PSV cm/sEDV cmsDescribe      Arm Pressure +----------+--------+-------+--------------+------------+ Subclavian               Not identified             +----------+--------+-------+--------------+------------+ +---------+--------+--+--------+--+---------+ VertebralPSV cm/s51EDV cm/s22Antegrade +---------+--------+--+--------+--+---------+ Left Carotid Findings: +----------+--------+--------+--------+------------+--------+           PSV cm/sEDV cm/sStenosisDescribe    Comments +----------+--------+--------+--------+------------+--------+ CCA Prox  110     26                                   +----------+--------+--------+--------+------------+--------+ CCA Distal90      28                                   +----------+--------+--------+--------+------------+--------+  ICA Prox  114     44              heterogenous         +----------+--------+--------+--------+------------+--------+ ICA Distal71      20                                   +----------+--------+--------+--------+------------+--------+ ECA       86      12                                   +----------+--------+--------+--------+------------+--------+ +----------+--------+--------+--------------+------------+ SubclavianPSV cm/sEDV cm/sDescribe      Arm Pressure +----------+--------+--------+--------------+------------+                           Not identified75           +----------+--------+--------+--------------+------------+ +---------+--------+--+--------+-+---------+ VertebralPSV cm/s38EDV cm/s5Antegrade +---------+--------+--+--------+-+---------+  ABI Findings:  +--------+------------------+-----+--------+--------+ Right   Rt Pressure (mmHg)IndexWaveformComment  +--------+------------------+-----+--------+--------+ Brachial                               TR Band  +--------+------------------+-----+--------+--------+ PTA     97                1.29 biphasic         +--------+------------------+-----+--------+--------+ DP      87                1.16 biphasic         +--------+------------------+-----+--------+--------+ +--------+------------------+-----+---------+-------+ Left    Lt Pressure (mmHg)IndexWaveform Comment +--------+------------------+-----+---------+-------+ Brachial75                     triphasic        +--------+------------------+-----+---------+-------+ PTA     108               1.44 biphasic         +--------+------------------+-----+---------+-------+ DP      99                1.32 biphasic         +--------+------------------+-----+---------+-------+ +-------+---------------+----------------+ ABI/TBIToday's ABI/TBIPrevious ABI/TBI +-------+---------------+----------------+ Right  1.29                            +-------+---------------+----------------+ Left   1.44                            +-------+---------------+----------------+  Right Doppler Findings: +--------+--------+-----+-------+--------+ Site    PressureIndexDopplerComments +--------+--------+-----+-------+--------+ Brachial                    TR Band  +--------+--------+-----+-------+--------+  Left Doppler Findings: +--------+--------+-----+---------+--------+ Site    PressureIndexDoppler  Comments +--------+--------+-----+---------+--------+ Brachial75           triphasic         +--------+--------+-----+---------+--------+ Radial               triphasic         +--------+--------+-----+---------+--------+ Ulnar                triphasic         +--------+--------+-----+---------+--------+   Summary: Right Carotid: There is no evidence of stenosis in the right ICA.  Left Carotid: Velocities in the left ICA are consistent with a 1-39% stenosis. Vertebrals: Bilateral vertebral arteries demonstrate antegrade flow. Right ABI: Resting right ankle-brachial index is within normal range. No evidence of significant right lower extremity arterial disease. Left ABI: Resting left ankle-brachial index indicates noncompressible left lower extremity arteries. Left Upper Extremity: Doppler waveform obliterate with left radial compression. Doppler waveform obliterate with left ulnar compression.  Electronically signed by Waverly Ferrari MD on 06/20/2019 at 8:32:09 AM.    Final     Microbiology Recent Results (from the past 240 hour(s))  SARS CORONAVIRUS 2 (TAT 6-24 HRS) Nasopharyngeal Nasopharyngeal Swab     Status: None   Collection Time: 06/17/2019  4:18 PM   Specimen: Nasopharyngeal Swab  Result Value Ref Range Status   SARS Coronavirus 2 NEGATIVE NEGATIVE Final    Comment: (NOTE) SARS-CoV-2 target nucleic acids are NOT DETECTED. The SARS-CoV-2 RNA is generally detectable in upper and lower respiratory specimens during the acute phase of infection. Negative results do not preclude SARS-CoV-2 infection, do not rule out co-infections with other pathogens, and should not be used as the sole basis for treatment or other patient management decisions. Negative results must be combined with clinical observations, patient history, and epidemiological information. The expected result is Negative. Fact Sheet for Patients: HairSlick.no Fact Sheet for Healthcare Providers: quierodirigir.com This test is not yet approved or cleared by the [place] FDA and  has been authorized for detection and/or diagnosis of SARS-CoV-2 by FDA under an Emergency Use Authorization (EUA). This EUA will remain  in effect (meaning this test can be used) for the  duration of the COVID-19 declaration under Section 56 4(b)(1) of the Act, 21 U.S.C. section 360bbb-3(b)(1), unless the authorization is terminated or revoked sooner. Performed at [facility], [address] [place], [place] [address]   Surgical pcr screen     Status: Abnormal   Collection Time: 06/02/2019  4:29 PM   Specimen: Nasal Mucosa; Nasal Swab  Result Value Ref Range Status   MRSA, PCR NEGATIVE NEGATIVE Final   Staphylococcus aureus POSITIVE (A) NEGATIVE Final    Comment: (NOTE) The Xpert SA Assay (FDA approved for NASAL specimens in patients 36 years of age and older), is one component of a comprehensive surveillance program. It is not intended to diagnose infection nor to guide or monitor treatment. Performed at [facility], [address], [place], [place] [address]     Lab Basic Metabolic Panel: Recent Labs  Lab 06/21/2019 1203  06/11/2019 2006  06/17/19 0402 06/17/19 1822 07-12-2019 0456 07-12-2019 1609 12-Jul-2019 1852 07-12-2019 1938 07/12/19 1948 07-12-2019 2009 Jul 12, 2019 2011  NA 141   < >  --    < > 140 139 140 138 139 135 137 139 140  K 3.8   < >  --    < > 3.5 3.5 4.1 3.5 3.4* 3.5 3.5 3.6 3.5  CL 104  --   --   --  106 103 102 98  --   --   --   --   --   CO2  --   --   --   --  --   --   --   --   --   GLUCOSE 206*  --   --   --  141* 206* 176* 189*  --   --   --   --   --   BUN 19  --   --   --  13 13 12 11   --   --   --   --   --   CREATININE 0.70  --  0.76  --  0.72 0.97 1.03* 0.84  --   --   --   --   --   CALCIUM  --   --   --   --  8.5* 8.8* 9.1 9.3  --   --   --   --   --   MG  --   --  2.7*  --  2.4 2.1  --  2.0  --   --   --   --   --    < > = values in this interval not displayed.   Liver Function Tests: No results for input(s): AST, ALT, ALKPHOS, BILITOT, PROT, ALBUMIN in the last 168 hours. No results for input(s): LIPASE, AMYLASE in the last 168 hours. No results for input(s): AMMONIA in the last 168 hours. CBC: Recent  Labs  Lab 06/02/2019 1304  05/31/2019 2006  06/17/19 0402 06/17/19 1822 07/15/19 0456 2019/07/15 1852 Jul 15, 2019 1938 2019/07/15 1948 15-Jul-2019 2009 07/15/19 2011  WBC 23.9*  --  19.0*  --  14.9* 15.2* 15.2*  --   --   --   --   --   HGB 11.5*   < > 11.6*   < > 10.5* 10.8* 10.8* 8.8* 9.9* 10.2* 10.2* 9.5*  HCT 35.7*   < > 35.0*   < > 32.6* 33.8* 33.0* 26.0* 29.0* 30.0* 30.0* 28.0*  MCV 93.7  --  91.9  --  93.9 94.9 91.9  --   --   --   --   --   PLT 233  --  246  --  212 187 187  --   --   --   --   --    < > = values in this interval not displayed.   Cardiac Enzymes: No results for input(s): CKTOTAL, CKMB, CKMBINDEX, TROPONINI in the last 168 hours. Sepsis Labs: Recent Labs  Lab 06/12/2019 2006 06/17/19 0402 06/17/19 1822 Jul 15, 2019 0456  WBC 19.0* 14.9* 15.2* 15.2*    Procedures/Operations                                                                                             CARDIOVASCULAR SURGERY OPERATIVE NOTE  05/26/2019  Surgeon:  Gaye Pollack, MD  First Assistant: Jadene Pierini,  PA-C   Preoperative Diagnosis:  Severe multi-vessel coronary artery disease   Postoperative Diagnosis:  Same   Procedure:  1. Median Sternotomy 2. Extracorporeal circulation 3.   Coronary artery bypass grafting x 3   Left internal mammary graft to the LAD  SVG to OM  SVG to RCA  4.   Endoscopic vein harvest from the right leg   Anesthesia:  General Endotracheal  John Giovanni PA-C 06/23/2019, 11:09 AM

## 2019-09-27 LAB — ECHO INTRAOPERATIVE TEE
AV Mean grad: 6 mmHg
Ao-prox: 3
Height: 66.5 in
LVOT diameter: 21 mm
MV Vena cont: 0.5 cm
Mean grad: 2 mmHg
STJ: 2.6 cm
Sinus: 2.8 cm
Weight: 3844.8 oz

## 2020-04-14 IMAGING — DX DG CHEST 1V PORT
1 series · 1 of 1 positions shown · non-contrast
Comparison: 06/16/2019.

CLINICAL DATA: Chest tube.  Prior CABG.

EXAM:
PORTABLE CHEST 1 VIEW

[chest ap]
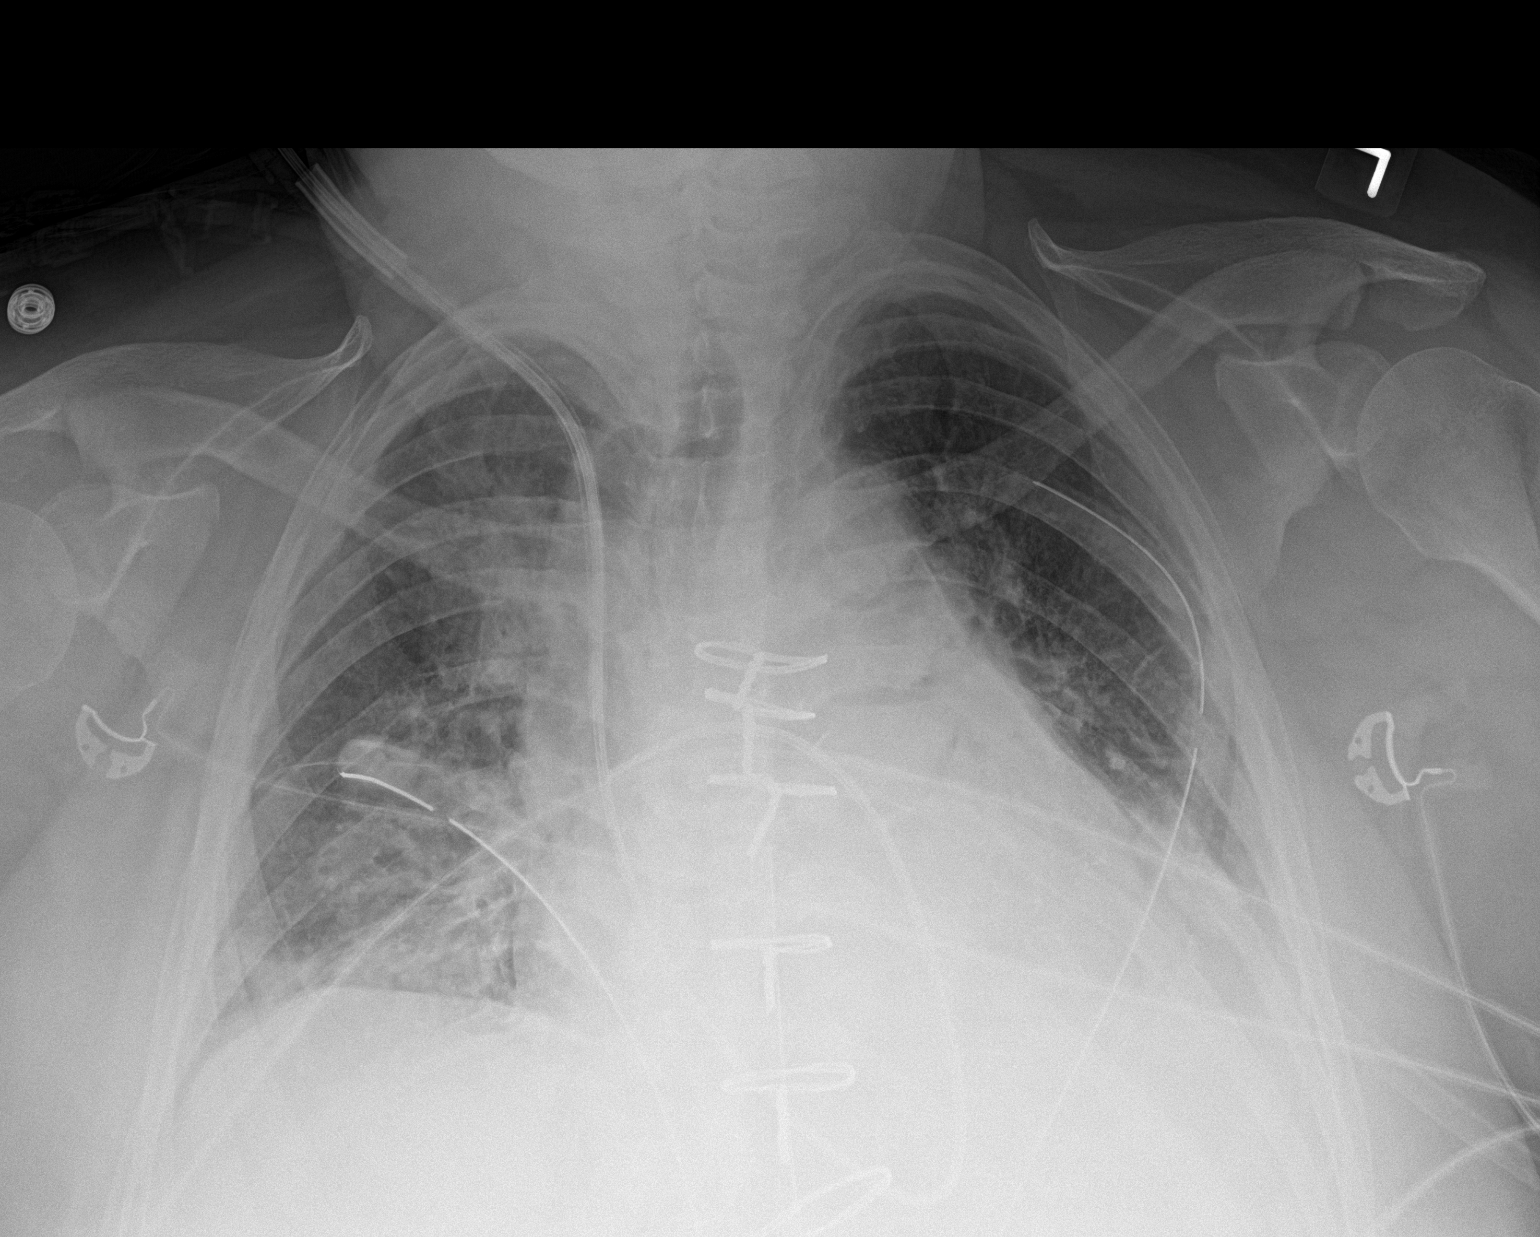

[1 of 1 positions shown; findings below may reference images not displayed]

FINDINGS: Interim extubation and removal of NG tube. Mediastinal drainage
catheter, Swan-Ganz catheter, bilateral chest tubes are in stable
position. Prior CABG. Stable cardiomegaly. Diffuse bilateral
interstitial prominence noted suggesting CHF. Low lung volumes with
worsening atelectatic changes and/or infiltrates in the lung bases.
Small left pleural effusion cannot be excluded. No pneumothorax.
IMPRESSION: 1. Interim extubation removal of NG tube. Remaining lines and tubes
including bilateral chest tubes in stable position. No pneumothorax.

2. Prior CABG. Stable cardiomegaly. Diffuse bilateral from
interstitial prominence noted suggesting CHF.

3. Low lung volumes with worsening atelectatic changes and/or
infiltrates in the lung bases. Small left pleural effusion cannot be
excluded.
